# Patient Record
Sex: Male | Born: 1985 | Race: White | Hispanic: No | Marital: Single | Smoking: Former smoker
Health system: Southern US, Community
[De-identification: ages and names within clinical notes are randomized; demographics above are authoritative.]

## PROBLEM LIST (undated history)

## (undated) DIAGNOSIS — F419 Anxiety disorder, unspecified: Secondary | ICD-10-CM

## (undated) DIAGNOSIS — F988 Other specified behavioral and emotional disorders with onset usually occurring in childhood and adolescence: Secondary | ICD-10-CM

## (undated) HISTORY — PX: EYE SURGERY: SHX253

## (undated) HISTORY — PX: APPENDECTOMY: SHX54

## (undated) HISTORY — DX: Other specified behavioral and emotional disorders with onset usually occurring in childhood and adolescence: F98.8

---
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
## 2010-06-05 ENCOUNTER — Ambulatory Visit: Payer: Self-pay | Admitting: Internal Medicine

## 2010-06-05 DIAGNOSIS — J930 Spontaneous tension pneumothorax: Secondary | ICD-10-CM

## 2010-06-05 DIAGNOSIS — J939 Pneumothorax, unspecified: Secondary | ICD-10-CM | POA: Insufficient documentation

## 2010-06-05 HISTORY — DX: Spontaneous tension pneumothorax: J93.0

## 2010-06-06 ENCOUNTER — Telehealth: Payer: Self-pay | Admitting: Internal Medicine

## 2010-07-15 ENCOUNTER — Ambulatory Visit: Payer: Self-pay | Admitting: Internal Medicine

## 2010-10-04 ENCOUNTER — Telehealth (INDEPENDENT_AMBULATORY_CARE_PROVIDER_SITE_OTHER): Payer: Self-pay | Admitting: *Deleted

## 2010-10-07 ENCOUNTER — Ambulatory Visit: Payer: Self-pay | Admitting: Internal Medicine

## 2010-10-07 ENCOUNTER — Telehealth: Payer: Self-pay | Admitting: Internal Medicine

## 2010-10-07 DIAGNOSIS — F909 Attention-deficit hyperactivity disorder, unspecified type: Secondary | ICD-10-CM | POA: Insufficient documentation

## 2010-10-07 HISTORY — DX: Attention-deficit hyperactivity disorder, unspecified type: F90.9

## 2010-10-07 LAB — CONVERTED CEMR LAB: TSH: 1.34 microintl units/mL (ref 0.35–5.50)

## 2011-01-30 NOTE — Assessment & Plan Note (Signed)
Summary: Primary svc/  ADD, refer to psych start Adderal   Copy to:  Self  CC:  Follow up.  Pt c/o restless and being easily distracted since workloard started picking up.  Has been on adderall in the past and believes he needs to be restarted on it. Marland Kitchen  History of Present Illness: 17 yowm social smoker but quit for good June 2011  with h/o  L PTX by symptoms but only documented 2 or 3 times but last documented on Dec 02 2004 always treated with bedrest and no intervention.     July 15, 2010 6 wk followup with cxr to f/uptx.  Pt states no complaints today- no problems since last seen. not needing any advil, no sob or cough, not smoking at all.   cxr no ptx, rec stay off cis  October 07, 2010 Follow up.  Pt c/o restless and being easily distracted since workloard started picking up.  Has been on adderall in the past and believes he needs to be restarted on it. Previously rec'd from Pediatrician 20 mg per day with benefit.  Pt denies any significant sore throat, dysphagia, itching, sneezing,  nasal congestion or excess secretions,  fever, chills, sweats, unintended wt loss, pleuritic or exertional cp, hempoptysis, change in activity tolerance  orthopnea pnd or leg swelling   Preventive Screening-Counseling & Management  Alcohol-Tobacco     Smoking Status: quit < 6 months     Smoking Cessation Counseling: yes  Current Medications (verified): 1)  None  Allergies (verified): No Known Drug Allergies  Past History:  Past Medical History: Recurrent L  ptx since 2004     - CT Chest 06/22/2003 5% and no blebs     - Dec 2005 L 5 %     - June 05, 2010 5% > 100% resolved July 15, 2010  ADD     - restart adderal October 07, 2010 and refer to psych  Social History: Smoking Status:  quit < 6 months  Vital Signs:  Patient profile:   25 year old male Height:      65 inches Weight:      106.50 pounds BMI:     17.79 O2 Sat:      100 % on Room air Temp:     98.3 degrees F oral Pulse rate:    63 / minute BP sitting:   120 / 66  (left arm) Cuff size:   regular  Vitals Entered By: Gweneth Dimitri RN (October 07, 2010 10:55 AM)  O2 Flow:  Room air CC: Follow up.  Pt c/o restless and being easily distracted since workloard started picking up.  Has been on adderall in the past and believes he needs to be restarted on it.  Comments Medications reviewed with patient Daytime contact number verified with patient. Gweneth Dimitri RN  October 07, 2010 10:55 AM    Physical Exam  Additional Exam:  thin amb anxious wm nad wt 107 June 05, 2010 > 106 July 15, 2010  > 106 October 07, 2010  HEENT: nl dentition, turbinates, and orophanx. Nl external ear canals without cough reflex NECK :  without JVD/Nodes/TM/ nl carotid upstrokes bilaterally LUNGS: no acc muscle use, clear to A and P bilaterally without cough on insp or exp maneuvers CV:  RRR  no s3 or murmur or increase in P2, no edema  ABD:  soft and nontender with nl excursion in the supine position. No bruits or organomegaly, bowel sounds nl MS:  warm without deformities, calf tenderness, cyanosis or clubbing    Tests: (1) TSH (TSH)   FastTSH                   1.34 uIU/mL                 0.35-5.50  Impression & Recommendations:  Problem # 1:  ATTENTION DEFICIT HYPERACTIVITY DISORDER, ADULT (ICD-314.01)  Orders: Psychiatric Referral (Psych) TLB-TSH (Thyroid Stimulating Hormone) (84443-TSH) Est. Patient Level III (16109)  TSH ok    Medications Added to Medication List This Visit: 1)  Adderall Xr 20 Mg Xr24h-cap (Amphetamine-dextroamphetamine) .... One daily  Patient Instructions: 1)  Start Adderal 20 mg daily as you did previously until seen by a psychiatrist who accepts your  insurance 2)  See Patient Care Coordinator before leaving for referral Prescriptions: ADDERALL XR 20 MG XR24H-CAP (AMPHETAMINE-DEXTROAMPHETAMINE) one daily  #30 x 0   Entered and Authorized by:   Nyoka Cowden MD   Signed by:   Nyoka Cowden MD on  10/07/2010   Method used:   Print then Give to Patient   RxID:   [id number]    Immunization History:  Influenza Immunization History:    Influenza:  historical (08/29/2010)  Pneumovax Immunization History:    Pneumovax:  historical (07/29/2010)

## 2011-01-30 NOTE — Assessment & Plan Note (Signed)
Summary: Pulmonary/ L PTX   Visit Type:  Initial Consult Copy to:  Self  CC:  CP and Dyspnea.  History of Present Illness: 82 yowm social smoker only with h/o  L PTX by symptoms but only documented 2 or 3 times but last documented on Dec 02 2004 always treated with bedrest and no intervention.  June 05, 2010 cc stereotypical pain starting 6/2 slt better since onset and has been off feet since.  This pain was worse than usual, all on the left side,  = front and back, assoc with mild sob.  Usually no warning and no sign coughing or sneeze. No unusual hobbies.  Pt denies any significant sore throat, dysphagia, itching, sneezing,  nasal congestion or excess secretions,  fever, chills, sweats, unintended wt loss, pleuritic or exertional cp, hempoptysis, change in activity tolerance  orthopnea pnd or leg swelling Pt also denies any obvious fluctuation in symptoms with weather or environmental change or other alleviating or aggravating factors.       Current Medications (verified): 1)  None  Allergies (verified): No Known Drug Allergies  Past History:  Past Medical History: Recurrent L  ptx since 2004     - CT Chest 06/22/2003 5% and no blebs     - Dec 2005 L 5 %     - June 05, 2010 5%  Family History: Asthma- Sister  Social History: Single Former smoker.  Quit in Feb 2011.  "Was a social smoker" Owns a Surveyor, quantity business and also works at [employer] no elicit drug use.   Review of Systems       The patient complains of shortness of breath with activity and chest pain.  The patient denies shortness of breath at rest, productive cough, non-productive cough, coughing up blood, irregular heartbeats, acid heartburn, indigestion, loss of appetite, weight change, abdominal pain, difficulty swallowing, sore throat, tooth/dental problems, headaches, nasal congestion/difficulty breathing through nose, sneezing, itching, ear ache, anxiety, depression, hand/feet swelling, joint stiffness  or pain, rash, change in color of mucus, and fever.    Vital Signs:  Patient profile:   25 year old male Height:      65 inches Weight:      107 pounds BMI:     17.87 O2 Sat:      99 % on Room air Temp:     97.4 degrees F oral Pulse rate:   74 / minute BP sitting:   104 / 78  (left arm)  Vitals Entered By: Vernie Murders (June 05, 2010 1:24 PM)  O2 Flow:  Room air  Physical Exam  Additional Exam:  thin amb wm nad wt 107 June 05, 2010 HEENT: nl dentition, turbinates, and orophanx. Nl external ear canals without cough reflex NECK :  without JVD/Nodes/TM/ nl carotid upstrokes bilaterally LUNGS: no acc muscle use, clear to A and P bilaterally without cough on insp or exp maneuvers CV:  RRR  no s3 or murmur or increase in P2, no edema  ABD:  soft and nontender with nl excursion in the supine position. No bruits or organomegaly, bowel sounds nl MS:  warm without deformities, calf tenderness, cyanosis or clubbing SKIN: warm and dry without lesions   NEURO:  alert, approp, no deficits     CXR  Procedure date:  06/05/2010  Findings:      Comparison: 12/23/2004   Findings: There is a left apical pneumothorax, estimated to measure approximately 5% of lung volume.  The right lung  is clear.  Heart size is normal.  No evidence for pulmonary edema.   IMPRESSION: Left apical pneumothorax  Impression & Recommendations:  Problem # 1:  SPONTANEOUS PNEUMOTHORAX (ICD-512.8)  ? recurrent vs persistent L PTX as all the cxr's on file look the same.  Either way he is at definite risk of recurrent/ lifethreatening ptx with no triggers or reversible factors idenfied  Rec: See instructions for specific recommendations   Orders: New Patient Level V (16109)  Other Orders: T-2 View CXR (71020TC)  Patient Instructions: 1)  No heavy lifting thru 06/13/2010 2)  If condition worsens go to ER 3)  NEEDS F/U OV in 6 weeks to regroup then re future concerns   CXR  Procedure date:   06/05/2010  Findings:      Comparison: 12/23/2004   Findings: There is a left apical pneumothorax, estimated to measure approximately 5% of lung volume.  The right lung is clear.  Heart size is normal.  No evidence for pulmonary edema.   IMPRESSION: Left apical pneumothorax

## 2011-01-30 NOTE — Progress Notes (Signed)
Summary: returned call  Phone Note Call from Patient Call back at [phone]   Caller: Patient Call For: Jerimey Burridge Summary of Call: Returning Leslie's call. Initial call taken by: Darletta Moll,  June 06, 2010 9:50 AM  Follow-up for Phone Call        pt advised of CXR per append. Appt set fro 07-15-10 at 2:20 with cxr first. order for cxr order placed. Carron Curie CMA  June 06, 2010 10:05 AM

## 2011-01-30 NOTE — Progress Notes (Signed)
Summary: RESULTS  Phone Note Call from Patient   Caller: Patient Call For: WERT Summary of Call: CALLING FOR THYROID RESULTS Initial call taken by: Rickard Patience,  October 07, 2010 5:00 PM  Follow-up for Phone Call        Patient is aware TSH level is normal. He would like to know what his blood type is. Is it okay to order additional lab? Please advise.Michel Bickers [facility]  October 07, 2010 5:06 PM this is a medically relevant study we don't do it though he could certainly [facility] if he wants to [facility] blood Follow-up by: Nyoka Cowden MD,  October 07, 2010 5:15 PM  Additional Follow-up for Phone Call Additional follow up Details #1::        called and spoke with pt and he is aware per MW recs to try the [facility] for this. Randell Loop CMA  October 07, 2010 5:19 PM

## 2011-01-30 NOTE — Progress Notes (Signed)
Summary: requests adderall  Phone Note Call from Patient   Caller: Patient Call For: wert Summary of Call: pt says he used to take adderall for his ADD (from childhood until 9th grade). says he feels he needs to take this again as he has trouble staying focused. 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 call his cell [phone] Initial call taken by: Tivis Ringer, CNA,  October 04, 2010 3:03 PM  Follow-up for Phone Call        Called, spoke with pt.  c/o being restless and easily distracted.  Has been on adderall in the past.  Thinks he needs this again.  ROV scheduled with MW for 10.10.11 at 10:20 to discuss with MW.  Pt aware.  Follow-up by: Gweneth Dimitri RN,  October 04, 2010 3:13 PM

## 2011-01-30 NOTE — Assessment & Plan Note (Signed)
Summary: Primary svc/ f/u cxr with ptx resolved   Copy to:  Self  CC:  6 wk followup with cxr.  Pt states no complaints today- no problems since last seen.Marland Kitchen  History of Present Illness: 24 yowm social smoker only with h/o  L PTX by symptoms but only documented 2 or 3 times but last documented on Dec 02 2004 always treated with bedrest and no intervention.  June 05, 2010 cc stereotypical pain starting 6/2 slt better since onset and has been off feet since.  This pain was worse than usual, all on the left side,  = front and back, assoc with mild sob.  Usually no warning and no sign coughing or sneeze. No unusual hobbies.  rec conservative rx > pain resolved after 2 weeks and stopped requiring advil.   July 15, 2010 6 wk followup with cxr.  Pt states no complaints today- no problems since last seen. not needing any advil, no sob or cough, not smoking at all. Pt denies any significant sore throat, dysphagia, itching, sneezing,  nasal congestion or excess secretions,  fever, chills, sweats, unintended wt loss, pleuritic or exertional cp, hempoptysis, change in activity tolerance  orthopnea pnd or leg swelling. occ aches and pains which are positional, better with icy hot.     Current Medications (verified): 1)  None  Allergies (verified): No Known Drug Allergies  Past History:  Past Medical History: Recurrent L  ptx since 2004     - CT Chest 06/22/2003 5% and no blebs     - Dec 2005 L 5 %     - June 05, 2010 5% > 100% resolved July 15, 2010   Vital Signs:  Patient profile:   25 year old male Weight:      106 pounds O2 Sat:      98 % on Room air Temp:     98.3 degrees F oral Pulse rate:   81 / minute BP sitting:   98 / 60  (left arm)  Vitals Entered By: Vernie Murders (July 15, 2010 2:16 PM)  O2 Flow:  Room air  Physical Exam  Additional Exam:  thin amb wm nad wt 107 June 05, 2010 > 106 July 15, 2010  HEENT: nl dentition, turbinates, and orophanx. Nl external ear canals without  cough reflex NECK :  without JVD/Nodes/TM/ nl carotid upstrokes bilaterally LUNGS: no acc muscle use, clear to A and P bilaterally without cough on insp or exp maneuvers CV:  RRR  no s3 or murmur or increase in P2, no edema  ABD:  soft and nontender with nl excursion in the supine position. No bruits or organomegaly, bowel sounds nl MS:  warm without deformities, calf tenderness, cyanosis or clubbing     CXR  Procedure date:  07/15/2010  Findings:      Resolved PTX  Impression & Recommendations:  Problem # 1:  SPONTANEOUS PNEUMOTHORAX (ICD-512.8)  Resolved, discussed how to approach recurrence but if w/in one year def needs Tsurg eval for pleurodesis  Orders: Est. Patient Level III (95621)  Patient Instructions: 1)  Return if chest pain recurs, if with short of breath go straight to the ER. 2)  Stretching/ advil for pain 3)  moderate exercise is ok but no heavy lifting/straining

## 2011-09-10 ENCOUNTER — Telehealth: Payer: Self-pay | Admitting: Internal Medicine

## 2011-09-10 ENCOUNTER — Ambulatory Visit (INDEPENDENT_AMBULATORY_CARE_PROVIDER_SITE_OTHER): Payer: BC Managed Care – PPO | Admitting: Pulmonary Disease

## 2011-09-10 ENCOUNTER — Encounter: Payer: Self-pay | Admitting: Pulmonary Disease

## 2011-09-10 VITALS — BP 102/60 | HR 94 | Temp 98.6°F | Ht 66.5 in | Wt 101.0 lb

## 2011-09-10 DIAGNOSIS — J069 Acute upper respiratory infection, unspecified: Secondary | ICD-10-CM

## 2011-09-10 NOTE — Progress Notes (Signed)
  Subjective:    Patient ID: Raymond [name], male    DOB: 31-Jul-1986, 25 y.o.   MRN: [id number]  HPI The patient comes in today for an acute sick visit.  He gives a three-day history of malaise, subjective fever, sore throat, but no significant nasal congestion or chest congestion.  He denies any cough or mucus production.  He has had no pressure in his sinuses or ears.  Denies all GI symptoms.   Review of Systems  Constitutional: Positive for chills. Negative for fever and unexpected weight change.  HENT: Positive for ear pain and sore throat. Negative for nosebleeds, congestion, rhinorrhea, sneezing, trouble swallowing, dental problem, postnasal drip and sinus pressure.   Eyes: Negative for redness and itching.  Respiratory: Positive for cough and shortness of breath. Negative for chest tightness and wheezing.   Cardiovascular: Negative for palpitations and leg swelling.  Gastrointestinal: Negative for nausea and vomiting.  Genitourinary: Negative for dysuria.  Musculoskeletal: Negative for joint swelling.  Skin: Negative for rash.  Neurological: Positive for headaches.  Hematological: Does not bruise/bleed easily.  Psychiatric/Behavioral: Negative for dysphoric mood. The patient is not nervous/anxious.        Objective:   Physical Exam Thin male in no acute distress Nose without purulence or discharge noted.  Minimal erythema Oropharynx without exudates, no erythema or swelling Neck without palpable lymphadenopathy Chest totally clear to auscultation Heart exam with regular rate and rhythm Lower extremities without edema Alert and oriented, moves all 4 extremities       Assessment & Plan:

## 2011-09-10 NOTE — Telephone Encounter (Signed)
I spoke with pt and he c/o chills, fever, congestion. Pt requested to be seen today. Since MW has no openings nor does TP. Per leslie okay to book pt with another provider. Pt is coming in to see KC at 12:00 today.

## 2011-09-10 NOTE — Assessment & Plan Note (Signed)
The patient's history is most consistent with a viral URI.  I have asked him to work on symptom relief with over-the-counter medications, but he is to let us know if he has any documented fever or if he begins to produce purulent mucus.  He is also to let us know if his condition worsens.

## 2011-09-10 NOTE — Patient Instructions (Signed)
Take tylenol cold and sinus per directions on box Drink plenty of fluids. If you have a fever of 101 or greater, or if you begin to have discolored mucus from nose or chest, please call us.  followup with Dr. Sherene Sires, but let us know if not getting better.

## 2011-09-11 ENCOUNTER — Ambulatory Visit: Payer: Self-pay | Admitting: Internal Medicine

## 2011-09-11 ENCOUNTER — Telehealth: Payer: Self-pay | Admitting: Internal Medicine

## 2011-09-11 MED ORDER — PROMETHAZINE HCL 25 MG RE SUPP: 25.0000 mg | Freq: Four times a day (QID) | RECTAL | Status: AC | PRN

## 2011-09-11 NOTE — Telephone Encounter (Signed)
It does sound like a viral illness.  Would stop advil.  Advance fluids as tolerated.  May have phenergan suppository 25mg  #6 1 PR every 6 hr As needed  Nausea/vomitting. - NO refills.  Advance diet as tolerated, avoid lactose for 5 days  Push fluids , if not improving or worsens with fevers, abdominal pain or chest pain will need sooner follow up or ER.  Will be glad to see in office in am if not improving.  If worsens will need ER Please contact office for sooner follow up if symptoms do not improve or worsen or seek emergency care

## 2011-09-11 NOTE — Telephone Encounter (Signed)
Spoke with pt. He was seen by [facility] yesterday for acute visit- sore throat, cough, chills. He states tried [employer] of advil cold and sinus, and is since then worse. He has been vomiting since this am, nausea, sore throat, aches, chills-unsure if he has fever or not b/c has no thermometer. He states thinks that he may have the flu. Will forward to TP for recs, pls advise, thanks!

## 2011-09-11 NOTE — Telephone Encounter (Signed)
Rx sent. Spoke with pt and notified of all recs per TP. He verbalized understanding and denied any questions. States will call first thing in the am for ov if not improving.

## 2011-09-12 ENCOUNTER — Ambulatory Visit: Payer: BC Managed Care – PPO | Admitting: Adult Health

## 2011-09-12 ENCOUNTER — Ambulatory Visit: Payer: BC Managed Care – PPO

## 2011-09-12 ENCOUNTER — Ambulatory Visit (INDEPENDENT_AMBULATORY_CARE_PROVIDER_SITE_OTHER): Payer: BC Managed Care – PPO | Admitting: Adult Health

## 2011-09-12 ENCOUNTER — Encounter: Payer: Self-pay | Admitting: Adult Health

## 2011-09-12 VITALS — BP 140/70 | HR 84 | Temp 97.7°F | Ht 65.0 in | Wt 101.0 lb

## 2011-09-12 DIAGNOSIS — J029 Acute pharyngitis, unspecified: Secondary | ICD-10-CM

## 2011-09-12 DIAGNOSIS — J069 Acute upper respiratory infection, unspecified: Secondary | ICD-10-CM

## 2011-09-12 LAB — BETA STREP SCREEN: Streptococcus, Group A Screen (Direct): NEGATIVE

## 2011-09-12 NOTE — Patient Instructions (Signed)
Salt water gargles As needed   Tylenol As needed  Fever /aches PUsh fluids  Advance bland diet as tolerated I will call with strep test Phenergan As needed  For nausea .  Please contact office for sooner follow up if symptoms do not improve or worsen or seek emergency care

## 2011-09-14 NOTE — Assessment & Plan Note (Signed)
Slow to resolve URI/ with resolving gastritis- appearing to be viral.  Strep test is neg.   Plan:  Salt water gargles As needed   Tylenol As needed  Fever /aches PUsh fluids  Advance bland diet as tolerated Phenergan As needed  For nausea .  Please contact office for sooner follow up if symptoms do not improve or worsen or seek emergency care

## 2011-09-14 NOTE — Progress Notes (Signed)
Subjective:    Patient ID: Raymond [name], male    DOB: [birth year], 25 y.o.   MRN: [id number]  HPI 44 yowm social smoker but quit for good June 2011 with h/o L PTX by symptoms but only documented 2 or 3 times but last documented on Dec 02 2004 always treated with bedrest and no intervention.   July 15, 2010 6 wk followup with cxr to f/uptx. Pt states no complaints today- no problems since last seen. not needing any advil, no sob or cough, not smoking at all. cxr no ptx, rec stay off cis   October 07, 2010 Follow up. Pt c/o restless and being easily distracted since workloard started picking up. Has been on adderall in the past and believes he needs to be restarted on it. Previously rec'd from Pediatrician 20 mg per day with benefit. >>Rx Adderall, and refer to Psych   09/12/11 Acute OV  Complains of  chills, sore throat, cough w/ clear phlem, chest congestion, , feels exhausted, loss of apetite for 5 days . Developed upset stomach 2 days ago with n/v. Had body aches, malaise and chills. Joint aches and nausea. Called into office with phenergan suppository phoned in. Nausea and vomitting have stopped. He is eating today. Still has sore throat. Painful to swallow.  Was seen in office 2 days ago by Dr. Shelle Iron with URI symptoms w/ recommendations for symptomatic tx. NO chest pain, pleuritic pain , diarrhea, urinary symptoms , rash , recent travel or abx use.    Past Medical History:  Recurrent L ptx since 2004  - CT Chest 06/22/2003 5% and no blebs  - Dec 2005 L 5 %  - June 05, 2010 5% > 100% resolved July 15, 2010  ADD  - restart adderal October 07, 2010 and refer to psych    Review of Systems Constitutional:   No  weight loss, night sweats,   +Fevers, chills, fatigue, or  lassitude.  HEENT:   No headaches,  Difficulty swallowing,  Tooth/dental problems,               + sneezing, itching, nasal congestion, post nasal drip,   CV:  No chest pain,  Orthopnea, PND, swelling in lower  extremities, anasarca, dizziness, palpitations, syncope.   GI  No heartburn, indigestion, abdominal pain, diarrhea, change in bowel habits, loss of appetite, bloody stools.   Resp: No shortness of breath with exertion or at rest.     No coughing up of blood.  No change in color of mucus.  No wheezing.  No chest wall deformity  Skin: no rash or lesions.  GU: no dysuria, change in color of urine, no urgency or frequency.  No flank pain, no hematuria   MS:  No joint pain or swelling.  No decreased range of motion.  No back pain.  Psych:  No change in mood or affect. No depression or anxiety.  No memory loss.         Objective:   Physical Exam GEN: A/Ox3; pleasant , NAD, thin male   HEENT:  [place]/AT,  EACs-clear, TMs-wnl, NOSE-clear, THROAT-red no exudate or  lesions, no postnasal drip or exudate noted.   NECK:  Supple w/ fair ROM; no JVD; normal carotid impulses w/o bruits; no thyromegaly or nodules palpated; no lymphadenopathy.  RESP  Clear  P & A; w/o, wheezes/ rales/ or rhonchi.no accessory muscle use, no dullness to percussion  CARD:  RRR, no m/r/g  , no peripheral edema, pulses intact, no  cyanosis or clubbing.  GI:   Soft & nt; nml bowel sounds; no organomegaly or masses detected.  Musco: Warm bil, no deformities or joint swelling noted.   Neuro: alert, no focal deficits noted.    Skin: Warm, no lesions or rashes         Assessment & Plan:

## 2011-09-26 ENCOUNTER — Emergency Department (HOSPITAL_COMMUNITY)
Admission: EM | Admit: 2011-09-26 | Discharge: 2011-09-27 | Disposition: A | Discharge status: Home / Self Care | Payer: BC Managed Care – PPO | Attending: Emergency Medicine | Admitting: Emergency Medicine

## 2011-09-26 DIAGNOSIS — R112 Nausea with vomiting, unspecified: Secondary | ICD-10-CM | POA: Insufficient documentation

## 2011-09-26 DIAGNOSIS — F1210 Cannabis abuse, uncomplicated: Secondary | ICD-10-CM | POA: Insufficient documentation

## 2011-09-26 DIAGNOSIS — F1010 Alcohol abuse, uncomplicated: Secondary | ICD-10-CM | POA: Insufficient documentation

## 2011-09-27 LAB — URINALYSIS, ROUTINE W REFLEX MICROSCOPIC
Bilirubin Urine: NEGATIVE
Glucose, UA: NEGATIVE mg/dL
Ketones, ur: NEGATIVE mg/dL
Leukocytes, UA: NEGATIVE
Nitrite: NEGATIVE
Protein, ur: NEGATIVE mg/dL
Specific Gravity, Urine: 1.031 — ABNORMAL HIGH (ref 1.005–1.030)
Urobilinogen, UA: 0.2 mg/dL (ref 0.0–1.0)
pH: 5 (ref 5.0–8.0)

## 2011-09-27 LAB — DIFFERENTIAL
Basophils Relative: 1 % (ref 0–1)
Eosinophils Absolute: 0.1 10*3/uL (ref 0.0–0.7)
Eosinophils Relative: 1 % (ref 0–5)
Lymphocytes Relative: 37 % (ref 12–46)
Monocytes Absolute: 0.3 10*3/uL (ref 0.1–1.0)
Monocytes Relative: 4 % (ref 3–12)
Neutrophils Relative %: 57 % (ref 43–77)

## 2011-09-27 LAB — CBC
MCH: 31.4 pg (ref 26.0–34.0)
MCHC: 35.9 g/dL (ref 30.0–36.0)
Platelets: 285 10*3/uL (ref 150–400)
RBC: 4.43 MIL/uL (ref 4.22–5.81)
RDW: 12.6 % (ref 11.5–15.5)
WBC: 7.8 10*3/uL (ref 4.0–10.5)

## 2011-09-27 LAB — POCT I-STAT, CHEM 8
BUN: 18 mg/dL (ref 6–23)
Calcium, Ion: 1.1 mmol/L — ABNORMAL LOW (ref 1.12–1.32)
Chloride: 107 mEq/L (ref 96–112)
Creatinine, Ser: 1.1 mg/dL (ref 0.50–1.35)
Glucose, Bld: 137 mg/dL — ABNORMAL HIGH (ref 70–99)
HCT: 43 % (ref 39.0–52.0)
Hemoglobin: 14.6 g/dL (ref 13.0–17.0)
Potassium: 3.6 mEq/L (ref 3.5–5.1)
Sodium: 143 mEq/L (ref 135–145)
TCO2: 22 mmol/L (ref 0–100)

## 2011-09-27 LAB — RAPID URINE DRUG SCREEN, HOSP PERFORMED
Amphetamines: NOT DETECTED
Barbiturates: NOT DETECTED
Benzodiazepines: NOT DETECTED
Cocaine: NOT DETECTED
Tetrahydrocannabinol: POSITIVE — AB

## 2011-09-27 LAB — ETHANOL: Alcohol, Ethyl (B): 80 mg/dL — ABNORMAL HIGH (ref 0–11)

## 2012-04-16 ENCOUNTER — Encounter: Payer: Self-pay | Admitting: *Deleted

## 2012-04-19 ENCOUNTER — Ambulatory Visit: Payer: BC Managed Care – PPO | Admitting: Internal Medicine

## 2012-09-15 ENCOUNTER — Other Ambulatory Visit (INDEPENDENT_AMBULATORY_CARE_PROVIDER_SITE_OTHER): Payer: BC Managed Care – PPO

## 2012-09-15 ENCOUNTER — Ambulatory Visit (INDEPENDENT_AMBULATORY_CARE_PROVIDER_SITE_OTHER): Payer: BC Managed Care – PPO | Admitting: Adult Health

## 2012-09-15 ENCOUNTER — Encounter: Payer: Self-pay | Admitting: Adult Health

## 2012-09-15 VITALS — BP 110/72 | HR 65 | Temp 97.5°F | Ht 65.0 in | Wt 101.4 lb

## 2012-09-15 DIAGNOSIS — R5383 Other fatigue: Secondary | ICD-10-CM

## 2012-09-15 DIAGNOSIS — J069 Acute upper respiratory infection, unspecified: Secondary | ICD-10-CM

## 2012-09-15 DIAGNOSIS — F419 Anxiety disorder, unspecified: Secondary | ICD-10-CM

## 2012-09-15 DIAGNOSIS — R5381 Other malaise: Secondary | ICD-10-CM

## 2012-09-15 DIAGNOSIS — F411 Generalized anxiety disorder: Secondary | ICD-10-CM

## 2012-09-15 LAB — CBC
HCT: 44.3 % (ref 39.0–52.0)
Hemoglobin: 14.7 g/dL (ref 13.0–17.0)
MCHC: 33.1 g/dL (ref 30.0–36.0)
MCV: 94.1 fl (ref 78.0–100.0)
RDW: 13.7 % (ref 11.5–14.6)
WBC: 6.8 10*3/uL (ref 4.5–10.5)

## 2012-09-15 LAB — BASIC METABOLIC PANEL
BUN: 18 mg/dL (ref 6–23)
CO2: 28 mEq/L (ref 19–32)
Calcium: 9.5 mg/dL (ref 8.4–10.5)
Chloride: 102 mEq/L (ref 96–112)
Creatinine, Ser: 1 mg/dL (ref 0.4–1.5)
Glucose, Bld: 137 mg/dL — ABNORMAL HIGH (ref 70–99)
Potassium: 4.6 mEq/L (ref 3.5–5.1)
Sodium: 138 mEq/L (ref 135–145)

## 2012-09-15 LAB — HEPATIC FUNCTION PANEL
ALT: 18 U/L (ref 0–53)
AST: 37 U/L (ref 0–37)
Albumin: 4.8 g/dL (ref 3.5–5.2)
Alkaline Phosphatase: 43 U/L (ref 39–117)
Bilirubin, Direct: 0.3 mg/dL (ref 0.0–0.3)
Total Bilirubin: 1.3 mg/dL — ABNORMAL HIGH (ref 0.3–1.2)
Total Protein: 7.6 g/dL (ref 6.0–8.3)

## 2012-09-15 LAB — BETA STREP SCREEN: Streptococcus, Group A Screen (Direct): NEGATIVE

## 2012-09-15 NOTE — Progress Notes (Signed)
Subjective:    Patient ID: Raymond [name], male    DOB: January 09, 1986, 26 y.o.   MRN: [id number]  HPI 64 yowm social smoker but quit for good June 2011 with h/o L PTX by symptoms but only documented 2 or 3 times but last documented on Dec 02 2004 always treated with bedrest and no intervention.   July 15, 2010 6 wk followup with cxr to f/uptx. Pt states no complaints today- no problems since last seen. not needing any advil, no sob or cough, not smoking at all. cxr no ptx, rec stay off cis   October 07, 2010 Follow up. Pt c/o restless and being easily distracted since workloard started picking up. Has been on adderall in the past and believes he needs to be restarted on it. Previously rec'd from Pediatrician 20 mg per day with benefit. >>Rx Adderall, and refer to Psych   09/12/11 Acute OV  Complains of  chills, sore throat, cough w/ clear phlem, chest congestion, , feels exhausted, loss of apetite for 5 days . Developed upset stomach 2 days ago with n/v. Had body aches, malaise and chills. Joint aches and nausea. Called into office with phenergan suppository phoned in. Nausea and vomitting have stopped. He is eating today. Still has sore throat. Painful to swallow.  Was seen in office 2 days ago by Dr. Shelle Iron with URI symptoms w/ recommendations for symptomatic tx. NO chest pain, pleuritic pain , diarrhea, urinary symptoms , rash , recent travel or abx use.  >>supportive /symptom tx   09/15/2012 Acute OV  Complains of sore throat , congested , drainage for 3 days and  tired, no energy for last 2 days.  He has not used any over-the-counter medications for treatment. Denies any discolored mucus, chest pain, shortness of breath, leg swelling, or hemoptysis. He does admit to smoking marijuana on a regular basis. Is currently seeing a psychologist for depression and anxiety and is on Tranxene and Celexa We discussed the dangers of using marijuana and alcohol along with his prescription  medications.     Past Medical History:  Recurrent L ptx since 2004  - CT Chest 06/22/2003 5% and no blebs  - Dec 2005 L 5 %  - June 05, 2010 5% > 100% resolved July 15, 2010  ADD  - restart adderal October 07, 2010 and refer to psych    Review of Systems Constitutional:   No  weight loss, night sweats,  Fevers, chills,  +fatigue, or  lassitude.  HEENT:   No headaches,  Difficulty swallowing,  Tooth/dental problems,               +nasal congestion, post nasal drip, sore throat   CV:  No chest pain,  Orthopnea, PND, swelling in lower extremities, anasarca, dizziness, palpitations, syncope.   GI  No heartburn, indigestion, abdominal pain, diarrhea, change in bowel habits, loss of appetite, bloody stools.   Resp: No shortness of breath with exertion or at rest.     No coughing up of blood.  No change in color of mucus.  No wheezing.  No chest wall deformity  Skin: no rash or lesions.  GU: no dysuria, change in color of urine, no urgency or frequency.  No flank pain, no hematuria   MS:  No joint pain or swelling.  No decreased range of motion.  No back pain.  Psych:  No change in mood or affect. No depression or anxiety.  No memory loss.  Objective:   Physical Exam GEN: A/Ox3; pleasant , NAD, thin male   HEENT:  [place]/AT,  EACs-clear, TMs-wnl, NOSE-clear, THROAT-red no exudate or  lesions, no postnasal drip or exudate noted.   NECK:  Supple w/ fair ROM; no JVD; normal carotid impulses w/o bruits; no thyromegaly or nodules palpated; no lymphadenopathy.  RESP  Clear  P & A; w/o, wheezes/ rales/ or rhonchi.no accessory muscle use, no dullness to percussion  CARD:  RRR, no m/r/g  , no peripheral edema, pulses intact, no cyanosis or clubbing.  GI:   Soft & nt; nml bowel sounds; no organomegaly or masses detected.  Musco: Warm bil, no deformities or joint swelling noted.   Neuro: alert, no focal deficits noted.    Skin: Warm, no lesions or rashes          Assessment & Plan:

## 2012-09-15 NOTE — Patient Instructions (Addendum)
Fluids and rest  Salt water gargles  Tylenol As needed   Claritin daily As needed  Drainage.  I will call with labs  Please contact office for sooner follow up if symptoms do not improve or worsen or seek emergency care

## 2012-09-16 DIAGNOSIS — F419 Anxiety disorder, unspecified: Secondary | ICD-10-CM | POA: Insufficient documentation

## 2012-09-16 NOTE — Assessment & Plan Note (Signed)
Advised on dangers of mixing etoh/marajana w/ his prescription drugs.  Complaints of fatigue and low energy  Will check labs but suspect combination of medications and use of polysubstance  ER notes reviewed w/ pt regarding etoh intoxication and marajuana use

## 2012-09-16 NOTE — Assessment & Plan Note (Signed)
Strep test neg  Plan Fluids and rest  Salt water gargles  Tylenol As needed   Claritin daily As needed  Drainage.  I will call with labs  Please contact office for sooner follow up if symptoms do not improve or worsen or seek emergency care

## 2012-09-17 NOTE — Progress Notes (Signed)
Quick Note:  Called spoke with patient, advised of lab results / recs as stated by TP. Pt verbalized his understanding and denied any questions. ______ 

## 2012-10-07 ENCOUNTER — Ambulatory Visit: Payer: BC Managed Care – PPO | Admitting: Pulmonary Disease

## 2012-10-07 ENCOUNTER — Ambulatory Visit (INDEPENDENT_AMBULATORY_CARE_PROVIDER_SITE_OTHER)
Admission: RE | Admit: 2012-10-07 | Discharge: 2012-10-07 | Disposition: A | Discharge status: Home / Self Care | Payer: BC Managed Care – PPO | Source: Ambulatory Visit | Attending: Internal Medicine | Admitting: Internal Medicine

## 2012-10-07 ENCOUNTER — Encounter: Payer: Self-pay | Admitting: *Deleted

## 2012-10-07 ENCOUNTER — Ambulatory Visit (INDEPENDENT_AMBULATORY_CARE_PROVIDER_SITE_OTHER): Payer: BC Managed Care – PPO | Admitting: Internal Medicine

## 2012-10-07 ENCOUNTER — Telehealth: Payer: Self-pay | Admitting: Internal Medicine

## 2012-10-07 ENCOUNTER — Other Ambulatory Visit: Payer: Self-pay | Admitting: Internal Medicine

## 2012-10-07 ENCOUNTER — Encounter: Payer: Self-pay | Admitting: Internal Medicine

## 2012-10-07 VITALS — BP 102/80 | HR 83 | Temp 97.7°F | Ht 65.0 in | Wt 101.4 lb

## 2012-10-07 DIAGNOSIS — Z8709 Personal history of other diseases of the respiratory system: Secondary | ICD-10-CM

## 2012-10-07 DIAGNOSIS — J930 Spontaneous tension pneumothorax: Secondary | ICD-10-CM

## 2012-10-07 DIAGNOSIS — R079 Chest pain, unspecified: Secondary | ICD-10-CM

## 2012-10-07 MED ORDER — TRAMADOL HCL 50 MG PO TABS: 50.0000 mg | ORAL_TABLET | Freq: Four times a day (QID) | ORAL | Status: DC | PRN

## 2012-10-07 NOTE — Assessment & Plan Note (Signed)
-   CT Chest 06/22/2003 5% and no blebs  - Dec 2005 L 5 %  - June 05, 2010 5% > 100% resolved July 15, 2010  - 10/07/2012 recurrence x 10% >  Referred to T Surgery  I had an extended discussion with the patient today lasting 15 to 20 minutes of a 25 minute visit on the following issues:  This is now the 4th recurrence so strongly rec surgical eval and he agreed to go > referred

## 2012-10-07 NOTE — Telephone Encounter (Signed)
Set pt for today @ 11:30 w/ Dr. Frederico Hamman per Mindy.  Pt verbalized understanding & stated nothing further needed at this time.  Raymond [name]

## 2012-10-07 NOTE — Telephone Encounter (Signed)
I spoke with pt and he c/o sharp pains in his chest, difficulty breathing, wheezing last night. He called EMS and his vitals checked out fine. I advised pt will need OV to be evaluated. He stated he is not able to come in bc his car is int he shop. I advised pt will need to see him for an evaluation. He is going to call back bc he is going to try and see if he can borrow someone's car.

## 2012-10-07 NOTE — Telephone Encounter (Signed)
MW aware of this- states that the pt at least needs to be seen by [facility] within a wks time.  I called and LMOM for the pt letting him know appt still pending and it is in his best interest to keep this, and that if he is worried about starting new job next wk, it makes sense to keep the appt for tomorrow so he won't have to miss work. We already gave work excuse for his other job. I asked that he call back asap so we can discuss. Will forward to MW so he is aware.

## 2012-10-07 NOTE — Progress Notes (Signed)
Subjective:    Patient ID: Raymond [name], male    DOB: 08-Oct-1986   MRN: [id number]  HPI 56 yowm social smoker with h/o L PTX by symptoms but only documented 2 or 3 times but last documented on Dec 02 2004 always treated with bedrest and no intervention.   July 15, 2010 6 wk followup with cxr to f/uptx. Pt states no complaints today- no problems since last seen. not needing any advil, no sob or cough, not smoking at all. cxr no ptx, rec stay off cis   October 07, 2010 Follow up. Pt c/o restless and being easily distracted since workloard started picking up. Has been on adderall in the past and believes he needs to be restarted on it. Previously rec'd from Pediatrician 20 mg per day with benefit. >>Rx Adderall, and refer to Psych   09/12/11 Acute OV  Complains of  chills, sore throat, cough w/ clear phlem, chest congestion, , feels exhausted, loss of apetite for 5 days . Developed upset stomach 2 days ago with n/v. Had body aches, malaise and chills. Joint aches and nausea. Called into office with phenergan suppository phoned in. Nausea and vomitting have stopped. He is eating today. Still has sore throat. Painful to swallow.  Was seen in office 2 days ago by Dr. Shelle Iron with URI symptoms w/ recommendations for symptomatic tx. NO chest pain, pleuritic pain , diarrhea, urinary symptoms , rash , recent travel or abx use.  >>supportive /symptom tx   09/15/2012 Acute OV  Complains of sore throat , congested , drainage for 3 days and  tired, no energy for last 2 days.  He has not used any over-the-counter medications for treatment. Denies any discolored mucus, chest pain, shortness of breath, leg swelling, or hemoptysis. He does admit to smoking marijuana on a regular basis. Is currently seeing a psychologist for depression and anxiety and is on Tranxene and Celexa We discussed the dangers of using marijuana and alcohol along with his prescription medications. rec No evidence of acute cardiopulmonary  disease.  10/07/2012 f/u ov/Raymond [name] quit smoking 09/01/12 cc acute onset ant L cp 1030 pm 10/9 rad to  L shoulder and back with  some increase  With Cough  but no purulent sputum or sob. Has not taken any meds yet.  Sleeping ok without nocturnal  or early am exacerbation  of respiratory  c/o's or need for noct saba. Also denies any obvious fluctuation of symptoms with weather or environmental changes or other aggravating or alleviating factors except as outlined above   ROS  The following are not active complaints unless bolded sore throat, dysphagia, dental problems, itching, sneezing,  nasal congestion or excess/ purulent secretions, ear ache,   fever, chills, sweats, unintended wt loss, pleuritic cp or exertional cp, hemoptysis,  orthopnea pnd or leg swelling, presyncope, palpitations, heartburn, abdominal pain, anorexia, nausea, vomiting, diarrhea  or change in bowel or urinary habits, change in stools or urine, dysuria,hematuria,  rash, arthralgias, visual complaints, headache, numbness weakness or ataxia or problems with walking or coordination,  change in mood/affect or memory.         Past Medical History:  Recurrent L ptx since 2004  - CT Chest 06/22/2003 5% and no blebs  - Dec 2005 L 5 %  - June 05, 2010 5% > 100% resolved July 15, 2010  - 10/07/2012 recurrence x 10% >  Referred to [facility] Surgery ADD  - restart adderal October 07, 2010 and refer to psych  Objective:   Physical Exam GEN: A/Ox3; pleasant , NAD, thin male  Wt Readings from Last 3 Encounters:  10/07/12 101 lb 6.4 oz (45.995 kg)  09/15/12 101 lb 6.4 oz (45.995 kg)  09/12/11 101 lb (45.813 kg)     HEENT:  [place]/AT,  EACs-clear, TMs-wnl, NOSE-clear, THROAT-red no exudate or  lesions, no postnasal drip or exudate noted.   NECK:  Supple w/ fair ROM; no JVD; normal carotid impulses w/o bruits; no thyromegaly or nodules palpated; no lymphadenopathy.  RESP  Clear  P & A; w/o, wheezes/ rales/ or rhonchi.no  accessory muscle use, no dullness to percussion  CARD:  RRR, no m/r/g  , no peripheral edema, pulses intact, no cyanosis or clubbing neg haman's crunch.  GI:   Soft & nt; nml bowel sounds; no organomegaly or masses detected.  Musco: Warm bil, no deformities or joint swelling noted.   Neuro: alert, no focal deficits noted.    Skin: Warm, no lesions or rashes   CXR  10/07/2012 : 10% left pneumothorax.        Assessment & Plan:

## 2012-10-07 NOTE — Patient Instructions (Addendum)
Stay off your feet for now and take tramadol 50 mg every 4 hours as needed for pain or cough.  Please see patient coordinator before you leave today  to schedule evaluation by a thoracic surgeon if more than a week delay in being seen by surgery you will need to return here for cxr.  We can excuse you from work at [employer] until the pneumothorax is gone or you see Designer, industrial/product, whichever comes first

## 2012-10-08 ENCOUNTER — Encounter: Payer: BC Managed Care – PPO | Admitting: Cardiothoracic Surgery
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
## 2012-10-08 NOTE — Telephone Encounter (Signed)
Called received from [facility] at [facility] (Dr. Tyrone Sage) pt did not show for his appointment today and did not call his office either. Just FYI for Dr. Sherene Sires. Rhonda J Cobb

## 2012-10-19 ENCOUNTER — Encounter: Payer: Self-pay | Admitting: *Deleted

## 2012-10-19 ENCOUNTER — Telehealth: Payer: Self-pay | Admitting: Internal Medicine

## 2012-10-19 NOTE — Telephone Encounter (Signed)
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 Note that he no showed for [facility] appt that we set up for him. Please advise, thanks!

## 2012-10-19 NOTE — Telephone Encounter (Signed)
Ok to return to work

## 2012-10-19 NOTE — Telephone Encounter (Signed)
Letter up front for pick up [facility] for pt to be aware

## 2012-10-19 NOTE — Telephone Encounter (Signed)
Pt returned call. Asks that nurse leave a detailed msg as he  "cannot continue to play phone tag- can't call back again today". Hazel Sams

## 2012-10-19 NOTE — Telephone Encounter (Signed)
Pt will be at this number till 1:30 after that not reachable till after 5:30 pnemothorax is better also needs note to go back to work

## 2012-10-19 NOTE — Telephone Encounter (Signed)
LMTCB

## 2012-10-19 NOTE — Telephone Encounter (Signed)
lmomtcb x1 

## 2012-10-26 ENCOUNTER — Encounter (HOSPITAL_BASED_OUTPATIENT_CLINIC_OR_DEPARTMENT_OTHER): Payer: Self-pay | Admitting: *Deleted

## 2012-10-26 ENCOUNTER — Emergency Department (HOSPITAL_BASED_OUTPATIENT_CLINIC_OR_DEPARTMENT_OTHER)
Admission: EM | Admit: 2012-10-26 | Discharge: 2012-10-26 | Discharge status: Against Medical Advice | Payer: BC Managed Care – PPO | Attending: Emergency Medicine | Admitting: Emergency Medicine

## 2012-10-26 DIAGNOSIS — R109 Unspecified abdominal pain: Secondary | ICD-10-CM | POA: Insufficient documentation

## 2012-10-26 DIAGNOSIS — Y92410 Unspecified street and highway as the place of occurrence of the external cause: Secondary | ICD-10-CM | POA: Insufficient documentation

## 2012-10-26 DIAGNOSIS — IMO0002 Reserved for concepts with insufficient information to code with codable children: Secondary | ICD-10-CM | POA: Insufficient documentation

## 2012-10-26 DIAGNOSIS — Y93I9 Activity, other involving external motion: Secondary | ICD-10-CM | POA: Insufficient documentation
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
## 2012-10-26 NOTE — ED Notes (Signed)
Tired of waiting to be seen. Leaving. No distress.

## 2012-10-28 ENCOUNTER — Ambulatory Visit (INDEPENDENT_AMBULATORY_CARE_PROVIDER_SITE_OTHER): Payer: BC Managed Care – PPO | Admitting: Adult Health

## 2012-10-28 ENCOUNTER — Ambulatory Visit (INDEPENDENT_AMBULATORY_CARE_PROVIDER_SITE_OTHER)
Admission: RE | Admit: 2012-10-28 | Discharge: 2012-10-28 | Disposition: A | Discharge status: Home / Self Care | Payer: BC Managed Care – PPO | Source: Ambulatory Visit | Attending: Adult Health | Admitting: Adult Health

## 2012-10-28 ENCOUNTER — Encounter: Payer: Self-pay | Admitting: Adult Health

## 2012-10-28 DIAGNOSIS — J930 Spontaneous tension pneumothorax: Secondary | ICD-10-CM

## 2012-10-28 DIAGNOSIS — R0781 Pleurodynia: Secondary | ICD-10-CM

## 2012-10-28 MED ORDER — HYDROCODONE-ACETAMINOPHEN 5-500 MG PO TABS: 1.0000 | ORAL_TABLET | Freq: Three times a day (TID) | ORAL | Status: AC | PRN

## 2012-10-28 NOTE — Patient Instructions (Addendum)
Advil 200mg  3 tabs Twice daily  For 5 days -take w/ food  Alternate ice and heat to ribs Vicodin 1 every 8 hr As needed  For pain .  I will call for labs and xray  Please contact office for sooner follow up if symptoms do not improve or worsen or seek emergency care

## 2012-10-29 ENCOUNTER — Ambulatory Visit: Payer: BC Managed Care – PPO | Admitting: Adult Health

## 2012-10-29 ENCOUNTER — Telehealth: Payer: Self-pay | Admitting: Adult Health

## 2012-10-29 NOTE — Telephone Encounter (Signed)
Raymond [name] called today and spoke with pt about his cxr results

## 2012-10-29 NOTE — Progress Notes (Signed)
Quick Note:  Called spoke with patient, advised of cxr results / recs as stated by TP. Pt verbalized his understanding and denied any questions. ______ 

## 2012-11-01 DIAGNOSIS — R0781 Pleurodynia: Secondary | ICD-10-CM | POA: Insufficient documentation

## 2012-11-01 NOTE — Assessment & Plan Note (Signed)
Resolved on xray today  follow up Dr. Sherene Sires  In 1 month and As needed
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
## 2013-02-04 ENCOUNTER — Encounter: Payer: Self-pay | Admitting: Adult Health

## 2013-02-04 ENCOUNTER — Ambulatory Visit (INDEPENDENT_AMBULATORY_CARE_PROVIDER_SITE_OTHER): Payer: BC Managed Care – PPO | Admitting: Adult Health

## 2013-02-04 ENCOUNTER — Other Ambulatory Visit (INDEPENDENT_AMBULATORY_CARE_PROVIDER_SITE_OTHER): Payer: BC Managed Care – PPO

## 2013-02-04 ENCOUNTER — Telehealth: Payer: Self-pay | Admitting: Adult Health

## 2013-02-04 ENCOUNTER — Ambulatory Visit (INDEPENDENT_AMBULATORY_CARE_PROVIDER_SITE_OTHER)
Admission: RE | Admit: 2013-02-04 | Discharge: 2013-02-04 | Disposition: A | Discharge status: Home / Self Care | Payer: BC Managed Care – PPO | Source: Ambulatory Visit | Attending: Adult Health | Admitting: Adult Health

## 2013-02-04 VITALS — BP 104/78 | HR 84 | Temp 96.8°F | Ht 65.0 in | Wt 100.0 lb

## 2013-02-04 DIAGNOSIS — F419 Anxiety disorder, unspecified: Secondary | ICD-10-CM

## 2013-02-04 DIAGNOSIS — J930 Spontaneous tension pneumothorax: Secondary | ICD-10-CM

## 2013-02-04 DIAGNOSIS — R079 Chest pain, unspecified: Secondary | ICD-10-CM

## 2013-02-04 DIAGNOSIS — F411 Generalized anxiety disorder: Secondary | ICD-10-CM

## 2013-02-04 LAB — D-DIMER, QUANTITATIVE: D-Dimer, Quant: 0.27 ug/mL-FEU (ref 0.00–0.48)

## 2013-02-04 NOTE — Progress Notes (Signed)
Quick Note:  LMOM informing pt that available labs are normal and will call as they become available. ______

## 2013-02-04 NOTE — Telephone Encounter (Signed)
Attempted to call the pt back at the number given but no answer and no machine to leave a message.  Will try back later.

## 2013-02-04 NOTE — Telephone Encounter (Signed)
Pt called and spoke with emily and he has appt with TP at 10:15.  Nothing further is needed.

## 2013-02-04 NOTE — Patient Instructions (Addendum)
Advil 200mg  2 tabs Twice daily  For 5 days  Warm heat to chest wall I will call with labs  Begin Zoloft 50mg  daily .  Follow up with 4 weeks  Please contact office for sooner follow up if symptoms do not improve or worsen or seek emergency care

## 2013-02-05 LAB — ALPHA-1-ANTITRYPSIN: A-1 Antitrypsin, Ser: 115 mg/dL (ref 90–200)

## 2013-02-07 ENCOUNTER — Encounter: Payer: Self-pay | Admitting: Adult Health

## 2013-02-07 DIAGNOSIS — R079 Chest pain, unspecified: Secondary | ICD-10-CM | POA: Insufficient documentation

## 2013-02-07 NOTE — Assessment & Plan Note (Signed)
Stress reducers  Continue with counseling  Begin Zoloft 50mg  daily .  Follow up with 4 weeks  Please contact office for sooner follow up if symptoms do not improve or worsen or seek emergency care

## 2013-02-07 NOTE — Progress Notes (Signed)
Quick Note:  LMOM TCB x1. Needs to come in for a redraw on the A1AT, previous lab ordered did not have a phenotype. First lab will not be charged to patient. ______
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
## 2013-02-07 NOTE — Assessment & Plan Note (Signed)
Atypical chest pain  cxr w/ no acute process , EKG w/ no acute process.  Troponin neg.  ? Musculoskeletal in nature vs panic attack   Plan  Advil 200mg  2 tabs Twice daily  For 5 days  Warm heat to chest wall I will call with labs  Begin Zoloft 50mg  daily .  Follow up with 4 weeks  Please contact office for sooner follow up if symptoms do not improve or worsen or seek emergency care

## 2013-02-25 ENCOUNTER — Telehealth: Payer: Self-pay | Admitting: Adult Health

## 2013-02-25 NOTE — Telephone Encounter (Signed)
Spoke with pt and explained what Alpha 1 is and he verbalized understanding

## 2013-03-04 ENCOUNTER — Ambulatory Visit: Payer: BC Managed Care – PPO | Admitting: Adult Health

## 2013-12-16 ENCOUNTER — Emergency Department (HOSPITAL_BASED_OUTPATIENT_CLINIC_OR_DEPARTMENT_OTHER): Payer: PRIVATE HEALTH INSURANCE

## 2013-12-16 ENCOUNTER — Encounter (HOSPITAL_COMMUNITY): Payer: PRIVATE HEALTH INSURANCE | Admitting: Certified Registered Nurse Anesthetist

## 2013-12-16 ENCOUNTER — Ambulatory Visit (HOSPITAL_BASED_OUTPATIENT_CLINIC_OR_DEPARTMENT_OTHER)
Admission: EM | Admit: 2013-12-16 | Discharge: 2013-12-17 | Disposition: A | Discharge status: Home / Self Care | Payer: PRIVATE HEALTH INSURANCE | Attending: General Surgery | Admitting: General Surgery

## 2013-12-16 ENCOUNTER — Encounter (HOSPITAL_BASED_OUTPATIENT_CLINIC_OR_DEPARTMENT_OTHER): Payer: Self-pay | Admitting: Emergency Medicine

## 2013-12-16 ENCOUNTER — Emergency Department (HOSPITAL_COMMUNITY): Payer: PRIVATE HEALTH INSURANCE | Admitting: Certified Registered Nurse Anesthetist

## 2013-12-16 ENCOUNTER — Encounter (HOSPITAL_COMMUNITY)
Admission: EM | Disposition: A | Discharge status: Home / Self Care | Payer: Self-pay | Source: Home / Self Care | Attending: Emergency Medicine

## 2013-12-16 DIAGNOSIS — K3580 Unspecified acute appendicitis: Secondary | ICD-10-CM

## 2013-12-16 DIAGNOSIS — F988 Other specified behavioral and emotional disorders with onset usually occurring in childhood and adolescence: Secondary | ICD-10-CM | POA: Insufficient documentation

## 2013-12-16 DIAGNOSIS — Z79899 Other long term (current) drug therapy: Secondary | ICD-10-CM | POA: Insufficient documentation

## 2013-12-16 DIAGNOSIS — K37 Unspecified appendicitis: Secondary | ICD-10-CM

## 2013-12-16 DIAGNOSIS — Z87891 Personal history of nicotine dependence: Secondary | ICD-10-CM | POA: Insufficient documentation

## 2013-12-16 HISTORY — PX: LAPAROSCOPIC APPENDECTOMY: SHX408

## 2013-12-16 LAB — URINALYSIS, ROUTINE W REFLEX MICROSCOPIC
Bilirubin Urine: NEGATIVE
Glucose, UA: NEGATIVE mg/dL
Hgb urine dipstick: NEGATIVE
Ketones, ur: 40 mg/dL — AB
Leukocytes, UA: NEGATIVE
pH: 8 (ref 5.0–8.0)

## 2013-12-16 LAB — COMPREHENSIVE METABOLIC PANEL
ALT: 14 U/L (ref 0–53)
AST: 23 U/L (ref 0–37)
Albumin: 5.5 g/dL — ABNORMAL HIGH (ref 3.5–5.2)
Alkaline Phosphatase: 46 U/L (ref 39–117)
CO2: 19 mEq/L (ref 19–32)
Chloride: 99 mEq/L (ref 96–112)
Creatinine, Ser: 0.9 mg/dL (ref 0.50–1.35)
GFR calc non Af Amer: >90 mL/min (ref >90)
Potassium: 3.1 mEq/L — ABNORMAL LOW (ref 3.5–5.1)
Sodium: 139 mEq/L (ref 135–145)
Total Bilirubin: 1.1 mg/dL (ref 0.3–1.2)

## 2013-12-16 LAB — CBC WITH DIFFERENTIAL/PLATELET
Basophils Absolute: 0 10*3/uL (ref 0.0–0.1)
Basophils Relative: 0 % (ref 0–1)
Eosinophils Absolute: 0 10*3/uL (ref 0.0–0.7)
HCT: 44 % (ref 39.0–52.0)
Lymphocytes Relative: 5 % — ABNORMAL LOW (ref 12–46)
MCHC: 35.9 g/dL (ref 30.0–36.0)
MCV: 88 fL (ref 78.0–100.0)
Monocytes Absolute: 1.6 10*3/uL — ABNORMAL HIGH (ref 0.1–1.0)
Neutro Abs: 21 10*3/uL — ABNORMAL HIGH (ref 1.7–7.7)
Neutrophils Relative %: 88 % — ABNORMAL HIGH (ref 43–77)
RDW: 12 % (ref 11.5–15.5)
WBC: 23.8 10*3/uL — ABNORMAL HIGH (ref 4.0–10.5)

## 2013-12-16 SURGERY — APPENDECTOMY, LAPAROSCOPIC
Anesthesia: General | Site: Abdomen

## 2013-12-16 MED ORDER — MORPHINE SULFATE 4 MG/ML IJ SOLN
4.0000 mg | Freq: Once | INTRAMUSCULAR | Status: AC
  Administered 2013-12-16: 4 mg via INTRAVENOUS
  Filled 2013-12-16: qty 1

## 2013-12-16 MED ORDER — ONDANSETRON HCL 4 MG PO TABS: 4.0000 mg | ORAL_TABLET | Freq: Four times a day (QID) | ORAL | Status: DC | PRN

## 2013-12-16 MED ORDER — LACTATED RINGERS IV SOLN: INTRAVENOUS | Status: DC

## 2013-12-16 MED ORDER — IOHEXOL 300 MG/ML  SOLN
50.0000 mL | Freq: Once | INTRAMUSCULAR | Status: AC | PRN
  Administered 2013-12-16: 50 mL via ORAL

## 2013-12-16 MED ORDER — HYDROCODONE-ACETAMINOPHEN 5-325 MG PO TABS
1.0000 | ORAL_TABLET | ORAL | Status: DC | PRN
  Administered 2013-12-16 – 2013-12-17 (×2): 1 via ORAL
  Filled 2013-12-16 (×2): qty 2

## 2013-12-16 MED ORDER — PROMETHAZINE HCL 25 MG/ML IJ SOLN: 6.2500 mg | INTRAMUSCULAR | Status: DC | PRN

## 2013-12-16 MED ORDER — SUCCINYLCHOLINE CHLORIDE 20 MG/ML IJ SOLN
INTRAMUSCULAR | Status: AC
  Filled 2013-12-16: qty 1

## 2013-12-16 MED ORDER — MIDAZOLAM HCL 2 MG/2ML IJ SOLN
INTRAMUSCULAR | Status: AC
  Filled 2013-12-16: qty 2

## 2013-12-16 MED ORDER — METOCLOPRAMIDE HCL 5 MG/ML IJ SOLN
10.0000 mg | Freq: Once | INTRAMUSCULAR | Status: AC
  Administered 2013-12-16: 10 mg via INTRAVENOUS
  Filled 2013-12-16: qty 2

## 2013-12-16 MED ORDER — ONDANSETRON HCL 4 MG/2ML IJ SOLN
INTRAMUSCULAR | Status: DC | PRN
  Administered 2013-12-16: 4 mg via INTRAVENOUS

## 2013-12-16 MED ORDER — LACTATED RINGERS IV SOLN
INTRAVENOUS | Status: DC | PRN
  Administered 2013-12-16: 17:00:00 via INTRAVENOUS

## 2013-12-16 MED ORDER — BUPIVACAINE HCL (PF) 0.25 % IJ SOLN
INTRAMUSCULAR | Status: AC
  Filled 2013-12-16: qty 30

## 2013-12-16 MED ORDER — EPHEDRINE SULFATE 50 MG/ML IJ SOLN
INTRAMUSCULAR | Status: DC | PRN
  Administered 2013-12-16: 5 mg via INTRAVENOUS

## 2013-12-16 MED ORDER — ONDANSETRON HCL 4 MG/2ML IJ SOLN
4.0000 mg | Freq: Once | INTRAMUSCULAR | Status: AC
  Administered 2013-12-16: 4 mg via INTRAVENOUS
  Filled 2013-12-16: qty 2

## 2013-12-16 MED ORDER — IOHEXOL 300 MG/ML  SOLN
100.0000 mL | Freq: Once | INTRAMUSCULAR | Status: AC | PRN
  Administered 2013-12-16: 100 mL via INTRAVENOUS

## 2013-12-16 MED ORDER — BUPIVACAINE HCL (PF) 0.25 % IJ SOLN
INTRAMUSCULAR | Status: DC | PRN
  Administered 2013-12-16: 30 mL

## 2013-12-16 MED ORDER — ROCURONIUM BROMIDE 100 MG/10ML IV SOLN
INTRAVENOUS | Status: DC | PRN
  Administered 2013-12-16: 30 mg via INTRAVENOUS

## 2013-12-16 MED ORDER — NEOSTIGMINE METHYLSULFATE 1 MG/ML IJ SOLN
INTRAMUSCULAR | Status: AC
  Filled 2013-12-16: qty 10

## 2013-12-16 MED ORDER — ACETAMINOPHEN 10 MG/ML IV SOLN
1000.0000 mg | Freq: Once | INTRAVENOUS | Status: DC
  Filled 2013-12-16 (×2): qty 100

## 2013-12-16 MED ORDER — ACETAMINOPHEN 325 MG PO TABS
650.0000 mg | ORAL_TABLET | Freq: Once | ORAL | Status: AC
  Administered 2013-12-16: 650 mg via ORAL

## 2013-12-16 MED ORDER — OXYCODONE HCL 5 MG/5ML PO SOLN: 5.0000 mg | Freq: Once | ORAL | Status: DC | PRN

## 2013-12-16 MED ORDER — NEOSTIGMINE METHYLSULFATE 1 MG/ML IJ SOLN
INTRAMUSCULAR | Status: DC | PRN
  Administered 2013-12-16: 3 mg via INTRAVENOUS

## 2013-12-16 MED ORDER — FENTANYL CITRATE 0.05 MG/ML IJ SOLN
INTRAMUSCULAR | Status: AC
  Filled 2013-12-16: qty 5

## 2013-12-16 MED ORDER — DEXTROSE 5 % IV SOLN
INTRAVENOUS | Status: AC
  Filled 2013-12-16: qty 1

## 2013-12-16 MED ORDER — ACETAMINOPHEN 10 MG/ML IV SOLN
INTRAVENOUS | Status: DC | PRN
  Administered 2013-12-16: 1000 mg via INTRAVENOUS

## 2013-12-16 MED ORDER — LIDOCAINE HCL (PF) 2 % IJ SOLN
INTRAMUSCULAR | Status: DC | PRN
  Administered 2013-12-16: 50 mg via INTRADERMAL

## 2013-12-16 MED ORDER — LORAZEPAM 2 MG/ML IJ SOLN
1.0000 mg | Freq: Once | INTRAMUSCULAR | Status: AC
  Administered 2013-12-16: 1 mg via INTRAVENOUS
  Filled 2013-12-16: qty 1

## 2013-12-16 MED ORDER — LACTATED RINGERS IR SOLN
Status: DC | PRN
  Administered 2013-12-16: 1000 mL

## 2013-12-16 MED ORDER — HYDROMORPHONE HCL PF 1 MG/ML IJ SOLN: 0.2500 mg | INTRAMUSCULAR | Status: DC | PRN

## 2013-12-16 MED ORDER — GLYCOPYRROLATE 0.2 MG/ML IJ SOLN
INTRAMUSCULAR | Status: AC
  Filled 2013-12-16: qty 2

## 2013-12-16 MED ORDER — MIDAZOLAM HCL 5 MG/5ML IJ SOLN
INTRAMUSCULAR | Status: DC | PRN
  Administered 2013-12-16: 2 mg via INTRAVENOUS

## 2013-12-16 MED ORDER — FENTANYL CITRATE 0.05 MG/ML IJ SOLN
INTRAMUSCULAR | Status: DC | PRN
  Administered 2013-12-16: 50 ug via INTRAVENOUS
  Administered 2013-12-16: 100 ug via INTRAVENOUS
  Administered 2013-12-16 (×2): 50 ug via INTRAVENOUS

## 2013-12-16 MED ORDER — SUCCINYLCHOLINE CHLORIDE 20 MG/ML IJ SOLN
INTRAMUSCULAR | Status: DC | PRN
  Administered 2013-12-16: 100 mg via INTRAVENOUS

## 2013-12-16 MED ORDER — DEXTROSE 5 % IV SOLN
2.0000 g | Freq: Once | INTRAVENOUS | Status: AC
  Administered 2013-12-16: 2 g via INTRAVENOUS
  Filled 2013-12-16: qty 2

## 2013-12-16 MED ORDER — ACETAMINOPHEN 325 MG PO TABS
ORAL_TABLET | ORAL | Status: AC
  Filled 2013-12-16: qty 2

## 2013-12-16 MED ORDER — EPHEDRINE SULFATE 50 MG/ML IJ SOLN
INTRAMUSCULAR | Status: AC
  Filled 2013-12-16: qty 1

## 2013-12-16 MED ORDER — KCL IN DEXTROSE-NACL 20-5-0.45 MEQ/L-%-% IV SOLN
INTRAVENOUS | Status: DC
  Administered 2013-12-16: 21:00:00 via INTRAVENOUS
  Filled 2013-12-16 (×3): qty 1000

## 2013-12-16 MED ORDER — IBUPROFEN 600 MG PO TABS
600.0000 mg | ORAL_TABLET | Freq: Four times a day (QID) | ORAL | Status: DC | PRN
  Administered 2013-12-17: 600 mg via ORAL
  Filled 2013-12-16: qty 1

## 2013-12-16 MED ORDER — ONDANSETRON HCL 4 MG/2ML IJ SOLN
INTRAMUSCULAR | Status: AC
  Filled 2013-12-16: qty 2

## 2013-12-16 MED ORDER — MEPERIDINE HCL 50 MG/ML IJ SOLN: 6.2500 mg | INTRAMUSCULAR | Status: DC | PRN

## 2013-12-16 MED ORDER — 0.9 % SODIUM CHLORIDE (POUR BTL) OPTIME
TOPICAL | Status: DC | PRN
  Administered 2013-12-16: 1000 mL

## 2013-12-16 MED ORDER — PROPOFOL 10 MG/ML IV BOLUS
INTRAVENOUS | Status: AC
  Filled 2013-12-16: qty 20

## 2013-12-16 MED ORDER — GLYCOPYRROLATE 0.2 MG/ML IJ SOLN
INTRAMUSCULAR | Status: DC | PRN
  Administered 2013-12-16: 0.4 mg via INTRAVENOUS

## 2013-12-16 MED ORDER — MORPHINE SULFATE 2 MG/ML IJ SOLN: 1.0000 mg | INTRAMUSCULAR | Status: DC | PRN

## 2013-12-16 MED ORDER — ROCURONIUM BROMIDE 100 MG/10ML IV SOLN
INTRAVENOUS | Status: AC
  Filled 2013-12-16: qty 1

## 2013-12-16 MED ORDER — SODIUM CHLORIDE 0.9 % IV BOLUS (SEPSIS)
1000.0000 mL | Freq: Once | INTRAVENOUS | Status: AC
  Administered 2013-12-16: 1000 mL via INTRAVENOUS

## 2013-12-16 MED ORDER — OXYCODONE HCL 5 MG PO TABS: 5.0000 mg | ORAL_TABLET | Freq: Once | ORAL | Status: DC | PRN

## 2013-12-16 MED ORDER — PROPOFOL 10 MG/ML IV BOLUS
INTRAVENOUS | Status: DC | PRN
  Administered 2013-12-16: 120 mg via INTRAVENOUS

## 2013-12-16 MED ORDER — HEPARIN SODIUM (PORCINE) 5000 UNIT/ML IJ SOLN
5000.0000 [IU] | Freq: Three times a day (TID) | INTRAMUSCULAR | Status: DC
  Administered 2013-12-17: 5000 [IU] via SUBCUTANEOUS
  Filled 2013-12-16 (×4): qty 1

## 2013-12-16 MED ORDER — ONDANSETRON HCL 4 MG/2ML IJ SOLN: 4.0000 mg | Freq: Four times a day (QID) | INTRAMUSCULAR | Status: DC | PRN

## 2013-12-16 MED ORDER — DEXTROSE 5 % IV SOLN
2.0000 g | Freq: Four times a day (QID) | INTRAVENOUS | Status: DC
  Administered 2013-12-16 – 2013-12-17 (×3): 2 g via INTRAVENOUS
  Filled 2013-12-16 (×5): qty 2

## 2013-12-16 SURGICAL SUPPLY — 45 items
ADH SKN CLS APL DERMABOND .7 (GAUZE/BANDAGES/DRESSINGS)
APL SKNCLS STERI-STRIP NONHPOA (GAUZE/BANDAGES/DRESSINGS) ×1
APPLIER CLIP ROT 10 11.4 M/L (STAPLE)
APR CLP MED LRG 11.4X10 (STAPLE)
BAG SPEC RTRVL LRG 6X4 10 (ENDOMECHANICALS) ×1
BENZOIN TINCTURE PRP APPL 2/3 (GAUZE/BANDAGES/DRESSINGS) ×2 IMPLANT
CANISTER SUCTION 2500CC (MISCELLANEOUS) ×2 IMPLANT
CLIP APPLIE ROT 10 11.4 M/L (STAPLE) IMPLANT
CUTTER FLEX LINEAR 45M (STAPLE) ×1 IMPLANT
DECANTER SPIKE VIAL GLASS SM (MISCELLANEOUS) ×1 IMPLANT
DERMABOND ADVANCED (GAUZE/BANDAGES/DRESSINGS)
DERMABOND ADVANCED .7 DNX12 (GAUZE/BANDAGES/DRESSINGS) IMPLANT
DRAPE LAPAROSCOPIC ABDOMINAL (DRAPES) ×2 IMPLANT
ELECT REM PT RETURN 9FT ADLT (ELECTROSURGICAL) ×2
ELECTRODE REM PT RTRN 9FT ADLT (ELECTROSURGICAL) ×1 IMPLANT
ENDOLOOP SUT PDS II  0 18 (SUTURE)
ENDOLOOP SUT PDS II 0 18 (SUTURE) IMPLANT
GLOVE BIOGEL PI IND STRL 7.0 (GLOVE) ×1 IMPLANT
GLOVE BIOGEL PI INDICATOR 7.0 (GLOVE) ×1
GLOVE SURG SIGNA 7.5 PF LTX (GLOVE) ×2 IMPLANT
GOWN PREVENTION PLUS LG XLONG (DISPOSABLE) ×2 IMPLANT
GOWN STRL REIN XL XLG (GOWN DISPOSABLE) ×4 IMPLANT
KIT BASIN OR (CUSTOM PROCEDURE TRAY) ×2 IMPLANT
PENCIL BUTTON HOLSTER BLD 10FT (ELECTRODE) IMPLANT
POUCH SPECIMEN RETRIEVAL 10MM (ENDOMECHANICALS) ×2 IMPLANT
RELOAD 45 VASCULAR/THIN (ENDOMECHANICALS) IMPLANT
RELOAD STAPLE 45 2.5 WHT GRN (ENDOMECHANICALS) IMPLANT
RELOAD STAPLE 45 3.5 BLU ETS (ENDOMECHANICALS) IMPLANT
RELOAD STAPLE TA45 3.5 REG BLU (ENDOMECHANICALS) ×2 IMPLANT
SCALPEL HARMONIC ACE (MISCELLANEOUS) ×2 IMPLANT
SET IRRIG TUBING LAPAROSCOPIC (IRRIGATION / IRRIGATOR) ×2 IMPLANT
SLEEVE ADV FIXATION 5X100MM (TROCAR) ×1 IMPLANT
SOLUTION ANTI FOG 6CC (MISCELLANEOUS) ×2 IMPLANT
STRIP CLOSURE SKIN 1/2X4 (GAUZE/BANDAGES/DRESSINGS) ×2 IMPLANT
SUT MON AB 5-0 PS2 18 (SUTURE) ×2 IMPLANT
TOWEL OR 17X26 10 PK STRL BLUE (TOWEL DISPOSABLE) ×2 IMPLANT
TOWEL OR NON WOVEN STRL DISP B (DISPOSABLE) ×2 IMPLANT
TRAY FOLEY CATH 14FRSI W/METER (CATHETERS) ×1 IMPLANT
TRAY FOLEY CATH 16FR SILVER (SET/KITS/TRAYS/PACK) ×1 IMPLANT
TRAY LAP CHOLE (CUSTOM PROCEDURE TRAY) ×2 IMPLANT
TROCAR ADV FIXATION 5X100MM (TROCAR) ×1 IMPLANT
TROCAR BLADELESS OPT 5 100 (ENDOMECHANICALS) ×2 IMPLANT
TROCAR XCEL BLUNT TIP 100MML (ENDOMECHANICALS) ×2 IMPLANT
TROCAR XCEL NON-BLD 11X100MML (ENDOMECHANICALS) ×2 IMPLANT
TUBING INSUFFLATION 10FT LAP (TUBING) ×2 IMPLANT

## 2013-12-16 NOTE — Preoperative (Signed)
Beta Blockers   Reason not to administer Beta Blockers:Not Applicable 

## 2013-12-16 NOTE — Transfer of Care (Signed)
Immediate Anesthesia Transfer of Care Note  Patient: Raymond [name]  Procedure(s) Performed: Procedure(s) (LRB): APPENDECTOMY LAPAROSCOPIC (N/A)  Patient Location: PACU  Anesthesia Type: General  Level of Consciousness: sedated, patient cooperative and responds to stimulation  Airway & Oxygen Therapy: Patient Spontanous Breathing and Patient connected to face mask oxgen  Post-op Assessment: Report given to PACU RN and Post -op Vital signs reviewed and stable  Post vital signs: Reviewed and stable  Complications: No apparent anesthesia complications

## 2013-12-16 NOTE — H&P (Signed)
Re:   Raymond [name] DOB:   [birth year] MRN:   [id number]  [facility] Admission  ASSESSMENT AND PLAN: 1.  Acute appendicitis  I discussed with the patient the indications and risks of appendiceal surgery.  The primary risks of appendiceal surgery include, but are not limited to, bleeding, infection, bowel surgery, and open surgery.  There is also the risk that the patient may have continued symptoms after surgery.  However, the likelihood of improvement in symptoms and return to the patient's normal status is good. We discussed the typical post-operative recovery course. I tried to answer the patient's questions.  2.  ADD  No taking meds 3.  History of spontaneous pneumothoraces  Last one Nov 2013.  Usually treated expectantly.  Chief Complaint  Patient presents with  . Abdominal Pain   REFERRING PHYSICIAN: Sandrea Hughs, MD  HISTORY OF PRESENT ILLNESS: Raymond [name] is a 27 y.o. (DOB: 1986/07/07)  white  male whose primary care physician is Sandrea Hughs, MD and comes to [facility] ER with appendicitis.  The patient has no history of GI problems.  He started having periumbilical pain around 9PM last night.  He vomited several times.  He then came to [facility] and was evaluated.  Dr. Blinda Leatherwood called me about the patient. He's had no prior abdominal surgery.  CT scan - 12/17/2103 - Dilated appendix without significant inflammatory changes this may represent some very early appendicitis.   Past Medical History  Diagnosis Date  . ADD (attention deficit disorder)   . Pneumothorax     Past Surgical History  Procedure Laterality Date  . Eye surgery      numerous eye surgeries      Current Facility-Administered Medications  Medication Dose Route Frequency Provider Last Rate Last Dose  . acetaminophen (TYLENOL) 325 MG tablet            Current Outpatient Prescriptions  Medication Sig Dispense Refill  . amphetamine-dextroamphetamine (ADDERALL XR) 20 MG 24 hr capsule Take 20 mg by  mouth daily as needed.        . clorazepate (TRANXENE) 15 MG tablet Take 1 tab by mouth four times daily or as needed         No Known Allergies  REVIEW OF SYSTEMS: Skin:  No history of rash.  No history of abnormal moles. Infection:  No history of hepatitis or HIV.  No history of MRSA. Neurologic:  No history of stroke.  No history of seizure.  No history of headaches. Cardiac:  No history of hypertension. No history of heart disease.  No history of prior cardiac catheterization.  No history of seeing a cardiologist. Pulmonary: History of spontaneous pneumothoraces.  Saw Dr Idamae Lusher a long time ago.  Last one Nov 2013.  Usually treated expectantly.  Endocrine:  No diabetes. No thyroid disease. Gastrointestinal:  No history of stomach disease.  No history of liver disease.  No history of gall bladder disease.  No history of pancreas disease.  No history of colon disease. Urologic:  No history of kidney stones.  No history of bladder infections. Musculoskeletal:  No history of joint or back disease. Hematologic:  No bleeding disorder.  No history of anemia.  Not anticoagulated. Psycho-social:  The patient is oriented.   History of ADD.  But stopped meds as of March 2014.  Has anxiety/stress for which he takes occasional lorazepam.  SOCIAL and FAMILY HISTORY: Single. Works for [employer].  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 Parents in the room.  Father, Burlin Mcnair, phone:  [phone].  PHYSICAL EXAM: BP 98/56  Pulse 88  Temp(Src) 101 F (38.3 C) (Oral)  Resp 18  SpO2 100%  General: WN thin WM who is alert and generally healthy appearing.  HEENT: Normal. Pupils equal. Neck: Supple. No mass.  No thyroid mass. Lymph Nodes:  No supraclavicular or cervical nodes. Lungs: Clear to auscultation and symmetric breath sounds. Heart:  RRR. No murmur or rub. Abdomen: Soft. No mass.  No hernia. Normal bowel sounds.  No abdominal scars. Tender lower abdomen, right  slightly worse than left.  No guarding or rebound. Rectal: Not done. Extremities:  Good strength and ROM  in upper and lower extremities. Neurologic:  Grossly intact to motor and sensory function. Psychiatric: Has normal mood and affect. Behavior is normal.   DATA REVIEWED: WBC - 23,800 - 12/16/2013  Ovidio Kin, MD,  [facility] [address] [place]  [address]   [place], [place]    [address] Phone:  [phone] FAX:  [phone]

## 2013-12-16 NOTE — Anesthesia Postprocedure Evaluation (Signed)
Anesthesia Post Note  Patient: Raymond [name]  Procedure(s) Performed: Procedure(s) (LRB): APPENDECTOMY LAPAROSCOPIC (N/A)  Anesthesia type: General  Patient location: PACU  Post pain: Pain level controlled  Post assessment: Post-op Vital signs reviewed  Last Vitals: BP 115/82  Pulse 82  Temp(Src) 36.7 C (Oral)  Resp 16  Ht 5\' 6"  (1.676 m)  Wt 107 lb (48.535 kg)  BMI 17.28 kg/m2  SpO2 98%  Post vital signs: Reviewed  Level of consciousness: sedated  Complications: No apparent anesthesia complications

## 2013-12-16 NOTE — Op Note (Signed)
Re:   Raymond [name] DOB:   [birth year] MRN:   [id number]                   FACILITY:  [facility]  DATE OF PROCEDURE: 12/16/2013                              OPERATIVE REPORT  PREOPERATIVE DIAGNOSIS:  Appendicitis  POSTOPERATIVE DIAGNOSIS:  Acute suppurative gangrenous appendicitis.  PROCEDURE:  Laparoscopic appendectomy.  SURGEON:  Sandria Bales. Ezzard Standing, MD  ASSISTANT:  No first assistant.  ANESTHESIA:  General endotracheal.  Anesthesiologist: Gaylan Gerold, MD CRNA: Vista Lawman, CRNA  ASA:  2E  ESTIMATED BLOOD LOSS:  Minimal.  DRAINS: none   SPECIMEN:   Appendix  COUNTS CORRECT:  YES  INDICATIONS FOR PROCEDURE: Raymond [name] is a 27 y.o. (DOB: 30-Sep-1986) white  male whose primary care doctor is Sandrea Hughs, MD and comes to the OR for an appendectomy.   I discussed with the patient, the indications and potential complications of appendiceal surgery.  The potential complications include, but are not limited to, bleeding, open surgery, bowel resection, and the possibility of another diagnosis.  OPERATIVE NOTE:  The patient underwent a general endotracheal anesthetic as supervised by Anesthesiologist: Gaylan Gerold, MD CRNA: Vista Lawman, CRNA, General, in room #1 at [facility]  The patient had been given 2 g of cefoxitin 4 hours pre op and was redosed with 1 g of cefoxitin at the beginning of the procedure and the abdomen was prepped with ChloraPrep.  The patient had a foley catheter placed at the beginning of the procedure.  A time-out was held and surgical checklist run.  An infraumbilical incision was made with sharp dissection carried down to the abdominal cavity.  An 12 mm Hasson trocar was inserted through the infraumbilical incision and into the peritoneal cavity.  A 0 degree 10 mm laparoscope was inserted through a 12 mm Hasson trocar and the Hasson trocar secured with a 0 Vicryl suture.  I placed a 5 mm balloon trocar in the right upper quadrant and 5 mm balloon torcar in  left lower quadrant and did abdominal exploration.    The right and left lobes of liver unremarkable.  The patient had a hole in his falciform ligament.  A photo was taken.  Stomach was unremarkable.  The pelvic organs were unremarkable.  I saw no other intra-abdominal abnormality.  The patient had appendicitis with the appendix located right pelvic brim.  The appendix was large for the patient's size, it was purulent and gangrenous.  The mesentery of the appendix was divided with a Harmonic scalpel.  I took a photo of the appendix before dividing it.  I got to the base of the appendix.  I then used a blue load 45 mm Ethicon Endo-GIA stapler and fired this across the base of the appendix.  I placed the appendix in EndoCatch bag and delivered the bag through the umbilical incision.  I irrigated the abdomen with 500 cc of saline.  After irrigating the abdomen, I then removed the trocars, in turn.  The umbilical port fascia was closed with 0 Vicryl suture.   I closed the skin each site with a 5-0 Monocryl suture and painted the wounds with Dermabond.  I then injected a total of 20 mL of 0.25% Marcaine at the incisions.  Sponge and needle count were correct at the end of the case.  The foley catheter was removed in the OR.  The patient was transferred to the recovery room in good condition.  The patient tolerated the procedure well and it depends on the patient's post op clinical course as to when the patient could be discharged.   Ovidio Kin, MD, [facility] Surgery Pager: [phone] Office phone:  [phone]

## 2013-12-16 NOTE — ED Notes (Signed)
Bed: QM57 Expected date:  Expected time:  Means of arrival:  Comments: Transfer from [facility]

## 2013-12-16 NOTE — Anesthesia Preprocedure Evaluation (Addendum)
Anesthesia Evaluation  Patient identified by MRN, date of birth, ID band Patient awake    Reviewed: Allergy & Precautions, H&P , NPO status , Patient's Chart, lab work & pertinent test results  Airway Mallampati: II TM Distance: >3 FB Neck ROM: Full    Dental  (+) Dental Advisory Given   Pulmonary neg pulmonary ROS, former smoker,  breath sounds clear to auscultation        Cardiovascular negative cardio ROS  Rhythm:Regular Rate:Normal     Neuro/Psych PSYCHIATRIC DISORDERS Anxiety negative neurological ROS     GI/Hepatic negative GI ROS, Neg liver ROS,   Endo/Other  negative endocrine ROS  Renal/GU negative Renal ROS     Musculoskeletal negative musculoskeletal ROS (+)   Abdominal   Peds  Hematology negative hematology ROS (+)   Anesthesia Other Findings   Reproductive/Obstetrics                         Anesthesia Physical Anesthesia Plan  ASA: I and emergent  Anesthesia Plan: General   Post-op Pain Management:    Induction: Intravenous, Rapid sequence and Cricoid pressure planned  Airway Management Planned: Oral ETT  Additional Equipment:   Intra-op Plan:   Post-operative Plan: Extubation in OR  Informed Consent: I have reviewed the patients History and Physical, chart, labs and discussed the procedure including the risks, benefits and alternatives for the proposed anesthesia with the patient or authorized representative who has indicated his/her understanding and acceptance.   Dental advisory given  Plan Discussed with: CRNA  Anesthesia Plan Comments:         Anesthesia Quick Evaluation

## 2013-12-16 NOTE — ED Notes (Signed)
Pt to room 9 with parents, reporting chills, abd pain, nausea x this am.

## 2013-12-16 NOTE — ED Notes (Signed)
Pt states "I just need pain medicine, I don't need any of that other medicine!" explained to pt that we are giving him medication for nausea and anxiety and that may help the pain.

## 2013-12-16 NOTE — ED Notes (Signed)
Per [facility], Pt went to [facility] for N/V, abdominal pain since last night. WBC - 23.8. And CT shows dilated appendix.Antibxfinished on the way over. Pt. Had tylenol about 35 minutes ago. 20gin R forearm.

## 2013-12-16 NOTE — ED Provider Notes (Signed)
CSN: [id number]     Arrival date & time 12/16/13  7829 History   First MD Initiated Contact with Patient 12/16/13 0749     No chief complaint on file.  (Consider location/radiation/quality/duration/timing/severity/associated sxs/prior Treatment) HPI Comments: Presents to the ER for evaluation of multiple complaints. Patient started last night with nausea and vomiting after eating dinner. He has had diffuse abdominal cramping. There has not been any fever or diarrhea. Vomiting has continued throughout the night. This morning the patient has started to feel short of breath. He is extremely anxious. Patient's respiratory rate is up and now is experiencing numbness and tingling in his hands and around his mouth.   Past Medical History  Diagnosis Date  . ADD (attention deficit disorder)   . Pneumothorax    No past surgical history on file. Family History  Problem Relation Age of Onset  . Asthma Sister    History  Substance Use Topics  . Smoking status: Former Smoker -- 1 years    Types: Cigarettes    Quit date: 09/01/2012  . Smokeless tobacco: Not on file     Comment: social smoker > "e-cig"  . Alcohol Use: Yes    Review of Systems  Respiratory: Positive for shortness of breath.   Gastrointestinal: Positive for nausea, vomiting and abdominal pain.  All other systems reviewed and are negative.    Allergies  Review of patient's allergies indicates no known allergies.  Home Medications   Current Outpatient Rx  Name  Route  Sig  Dispense  Refill  . amphetamine-dextroamphetamine (ADDERALL XR) 20 MG 24 hr capsule   Oral   Take 20 mg by mouth daily as needed.           . clorazepate (TRANXENE) 15 MG tablet      Take 1 tab by mouth four times daily or as needed          There were no vitals taken for this visit. Physical Exam  Constitutional: He is oriented to person, place, and time. He appears well-developed and well-nourished. He appears distressed.  HENT:  Head:  Normocephalic and atraumatic.  Right Ear: Hearing normal.  Left Ear: Hearing normal.  Nose: Nose normal.  Mouth/Throat: Oropharynx is clear and moist and mucous membranes are normal.  Eyes: Conjunctivae and EOM are normal. Pupils are equal, round, and reactive to light.  Neck: Normal range of motion. Neck supple.  Cardiovascular: Regular rhythm, S1 normal and S2 normal.  Exam reveals no gallop and no friction rub.   No murmur heard. Pulmonary/Chest: Breath sounds normal. Tachypnea noted. No respiratory distress. He exhibits no tenderness.  Abdominal: Soft. Normal appearance and bowel sounds are normal. There is no hepatosplenomegaly. There is generalized tenderness. There is no rebound, no guarding, no tenderness at McBurney's point and negative Murphy's sign. No hernia.  Musculoskeletal: Normal range of motion.  Neurological: He is alert and oriented to person, place, and time. He has normal strength. No cranial nerve deficit or sensory deficit. Coordination normal. GCS eye subscore is 4. GCS verbal subscore is 5. GCS motor subscore is 6.  Skin: Skin is warm, dry and intact. No rash noted. No cyanosis.  Psychiatric: His speech is normal and behavior is normal. Thought content normal. His mood appears anxious.    ED Course  Procedures (including critical care time) Labs Review Labs Reviewed  CBC WITH DIFFERENTIAL  COMPREHENSIVE METABOLIC PANEL  LIPASE, BLOOD  URINALYSIS, ROUTINE W REFLEX MICROSCOPIC   Imaging Review No results found.  EKG Interpretation    Date/Time:  Friday December 16 2013 07:52:18 EST Ventricular Rate:  98 PR Interval:  154 QRS Duration: 84 QT Interval:  364 QTC Calculation: 464 R Axis:   82 Text Interpretation:  Normal sinus rhythm Normal ECG Confirmed by POLLINA  MD, CHRISTOPHER ([id number]) on 12/16/2013 7:57:06 AM            MDM  Diagnosis: Abdominal pain, possible early appendicitis  The patient presents to the ER for evaluation of abdominal pain  which began last night. He has had chills, but no documented fever. Patient's pain has worsened overnight and into this morning. Arrival, patient is hyperventilating, extremely anxious. Examination was difficult arrival due to the level of his anxiety.  Patient anxiety and hyperventilation was treated with Ativan. Patient also administered IV fluids and Zofran. He required additional antiemetic (reglan) to drink the contrast for CAT scan and was given morphine pain. After this he became much more calm and repeat examination revealed tenderness with slight voluntary guarding in the right lower quadrant.  Blood Work reveals significant leukocytosis. CT scan was performed to further evaluate. CT scan shows dilated appendix with fluid and denser material within. The are no inflammatory changes noted, but this can still represent early appendicitis. Based on the examination, this is a concern. General surgery therefore consulted. Patient was sent to is a lot about the department and Doctor Ezzard Standing will evaluate the patient.     Gilda Crease, MD 12/16/13 1336

## 2013-12-17 MED ORDER — HYDROCODONE-ACETAMINOPHEN 5-325 MG PO TABS: 1.0000 | ORAL_TABLET | ORAL | Status: DC | PRN

## 2013-12-17 NOTE — Progress Notes (Signed)
Assessment unchanged. Ambulating in room and hall this am. Pt and mother verbalized understanding of dc instructions through teach back. Script x 1 given to pt as provided by MD. Pt introduced to My Chart with verbalized understanding and plans to sign up soon. Discharged via wc to front entrance to meet awaiting vehicle to carry home. Accompanied by NT and parents.

## 2013-12-17 NOTE — Progress Notes (Signed)
Patient ID: Raymond [name], male   DOB: 09/16/1986, 27 y.o.   MRN: [id number] 1 Day Post-Op  Subjective: Feels well. Denies any pain, just incisional soreness. No nausea.  Objective: Vital signs in last 24 hours: Temp:  [98 F (36.7 C)-101 F (38.3 C)] 98.2 F (36.8 C) (12/20 0530) Pulse Rate:  [69-104] 75 (12/20 0530) Resp:  [16-24] 16 (12/20 0530) BP: (70-115)/(42-82) 91/53 mmHg (12/20 0530) SpO2:  [96 %-100 %] 98 % (12/20 0530) Weight:  [107 lb (48.535 kg)] 107 lb (48.535 kg) (12/19 1954)    Intake/Output from previous day: 12/19 0701 - 12/20 0700 In: 3361.7 [P.O.:550; I.V.:2711.7; IV Piggyback:100] Out: 1250 [Urine:1200; Emesis/NG output:50] Intake/Output this shift:    General appearance: alert, cooperative and no distress GI: normal findings: soft, non-tender Incision/Wound: clean and dry  Lab Results:   Recent Labs  12/16/13 0750  WBC 23.8*  HGB 15.8  HCT 44.0  PLT 250   BMET  Recent Labs  12/16/13 0750  NA 139  K 3.1*  CL 99  CO2 19  GLUCOSE 124*  BUN 12  CREATININE 0.90  CALCIUM 10.1     Studies/Results: Ct Abdomen Pelvis W Contrast  12/16/2013   CLINICAL DATA:  Abdominal pain  EXAM: CT ABDOMEN AND PELVIS WITH CONTRAST  TECHNIQUE: Multidetector CT imaging of the abdomen and pelvis was performed using the standard protocol following bolus administration of intravenous contrast.  CONTRAST:  50mL OMNIPAQUE IOHEXOL 300 MG/ML SOLN, OMNIPAQUE IOHEXOL 300 MG/ML SOLN  COMPARISON:  Plain film from earlier in the same day.  FINDINGS: The lung bases are free of acute infiltrate or sizable effusion. The liver, gallbladder, spleen, adrenal glands and pancreas are normal in their CT appearance. The kidneys are well visualized bilaterally without calculi or obstructive changes.  The appendix is dilated with both fluid and denser material within. No significant inflammatory changes are noted although this could represent very early appendicitis. Clinical  correlation is recommended the bladder is well distended. No pelvic mass lesion is seen. No free fluid is noted.  IMPRESSION: Dilated appendix without significant inflammatory changes this may represent some very early appendicitis. Clinical correlation is recommended.  No other focal abnormality is seen.   Electronically Signed   By: Alcide Clever M.D.   On: 12/16/2013 12:28   Dg Abd Acute W/chest  12/16/2013   CLINICAL DATA:  Abdominal cramping  EXAM: ACUTE ABDOMEN SERIES (ABDOMEN 2 VIEW & CHEST 1 VIEW)  COMPARISON:  Chest x-ray 02/04/2013  FINDINGS: There is no evidence of dilated bowel loops or free intraperitoneal air. There is radiodense material within the ascending colon likely reflecting ingested material. No radiopaque calculi or other significant radiographic abnormality is seen. Heart size and mediastinal contours are within normal limits. Both lungs are clear.  IMPRESSION: Negative abdominal radiographs.  No acute cardiopulmonary disease.   Electronically Signed   By: Elige Ko   On: 12/16/2013 09:15    Anti-infectives: Anti-infectives   Start     Dose/Rate Route Frequency Ordered Stop   12/16/13 2000  cefOXitin (MEFOXIN) 2 g in dextrose 5 % 50 mL IVPB     2 g 100 mL/hr over 30 Minutes Intravenous Every 6 hours 12/16/13 1949     12/16/13 1345  cefOXitin (MEFOXIN) 2 g in dextrose 5 % 50 mL IVPB     2 g 100 mL/hr over 30 Minutes Intravenous  Once 12/16/13 1338 12/16/13 1425      Assessment/Plan: s/p Procedure(s): APPENDECTOMY LAPAROSCOPIC Doing very  well. Feels well abdomen is entirely benign. Looks okay for discharge. Instructions given and followup in the office in 2 weeks.   LOS: 1 day    Tag Wurtz T 12/17/2013

## 2013-12-17 NOTE — Discharge Summary (Signed)
   Patient ID: VIDYUTH BELSITO [id number] 26 y.o. July 18, 1986  12/16/2013  Discharge date and time: 12/17/2013   Admitting Physician: Glenna Fellows T  Discharge Physician: Glenna Fellows T  Admission Diagnoses: Appendicitis [541]  Discharge Diagnoses: same  Operations: Procedure(s): APPENDECTOMY LAPAROSCOPIC  Admission Condition: good  Discharged Condition: good  Indication for Admission: patient is a 28 year old male who presented with acute lower abdominal pain and on workup was found to have evidence of acute appendicitis. He is admitted for emergency appendectomy.  Hospital Course: patient underwent emergency laparoscopic appendectomy on the day of admission. He had a severely inflamed appendix but no perforation or contamination. On the first postoperative day his pain is completely relieved. Abdomen is nontender and having just minimal incisional soreness. He is afebrile with normal vital signs. His already for discharge.   Disposition: Home  Patient Instructions:    Medication List         HYDROcodone-acetaminophen 5-325 MG per tablet  Commonly known as:  NORCO/VICODIN  Take 1-2 tablets by mouth every 4 (four) hours as needed for moderate pain.     LORazepam 1 MG tablet  Commonly known as:  ATIVAN  Take 1 mg by mouth daily as needed for anxiety.        Activity: activity as tolerated Diet: regular diet Wound Care: none needed  Follow-up:  With Dr. Ezzard Standing in 2 weeks.  Signed: Mariella Saa MD, FACS  12/17/2013, 9:37 AM

## 2013-12-19 ENCOUNTER — Encounter (HOSPITAL_COMMUNITY): Payer: Self-pay | Admitting: Surgery

## 2014-01-05 ENCOUNTER — Ambulatory Visit (INDEPENDENT_AMBULATORY_CARE_PROVIDER_SITE_OTHER): Payer: PRIVATE HEALTH INSURANCE | Admitting: Surgery

## 2014-01-05 ENCOUNTER — Encounter (INDEPENDENT_AMBULATORY_CARE_PROVIDER_SITE_OTHER): Payer: Self-pay | Admitting: Surgery

## 2014-01-05 VITALS — BP 110/61 | HR 66 | Temp 98.0°F | Resp 14 | Ht 66.0 in | Wt 104.2 lb

## 2014-01-05 DIAGNOSIS — K3580 Unspecified acute appendicitis: Secondary | ICD-10-CM

## 2014-01-05 NOTE — Progress Notes (Signed)
Postop visit  Mr. Raymond [name] had a lap appendectomy on 12/16/2013. He has done well without incident. He went back to work last week. His father is with him.  BP 110/61  Pulse 66  Temp(Src) 98 F (36.7 C) (Temporal)  Resp 14  Ht 5\' 6"  (1.676 m)  Wt 104 lb 3.2 oz (47.265 kg)  BMI 16.83 kg/m2  His wounds look good.  I made his return appointment on a when necessary basis.  Path copy to patient.  Ovidio Kinavid Lydiann Bonifas, MD, FACS Pager: [phone] [facility] Office: [phone] 01/05/2014

## 2014-05-08 ENCOUNTER — Telehealth: Payer: Self-pay | Admitting: Internal Medicine

## 2014-05-08 NOTE — Telephone Encounter (Signed)
[email or web address].Jennifer Castillo, CMA  

## 2014-05-08 NOTE — Telephone Encounter (Signed)
I don't know who is - it[place] very hard to find one in [place] but he should try [facility] and [facility] offices too

## 2014-05-08 NOTE — Telephone Encounter (Signed)
Dr Sherene SiresWert, do you have any specific rec on new PCP  Nobody downstairs taking new pt's  Please advise thanks

## 2014-05-09 NOTE — Telephone Encounter (Signed)
lmomtcb x 2  

## 2014-05-09 NOTE — Telephone Encounter (Signed)
Pt advised. Raymond [name], CMA  

## 2014-05-10 ENCOUNTER — Telehealth: Payer: Self-pay | Admitting: Internal Medicine

## 2014-05-10 ENCOUNTER — Encounter: Payer: Self-pay | Admitting: Internal Medicine

## 2014-05-10 ENCOUNTER — Ambulatory Visit (INDEPENDENT_AMBULATORY_CARE_PROVIDER_SITE_OTHER): Payer: BC Managed Care – PPO | Admitting: Internal Medicine

## 2014-05-10 ENCOUNTER — Ambulatory Visit (INDEPENDENT_AMBULATORY_CARE_PROVIDER_SITE_OTHER)
Admission: RE | Admit: 2014-05-10 | Discharge: 2014-05-10 | Disposition: A | Discharge status: Home / Self Care | Payer: BC Managed Care – PPO | Source: Ambulatory Visit | Attending: Internal Medicine | Admitting: Internal Medicine

## 2014-05-10 ENCOUNTER — Other Ambulatory Visit (INDEPENDENT_AMBULATORY_CARE_PROVIDER_SITE_OTHER): Payer: BC Managed Care – PPO

## 2014-05-10 VITALS — BP 152/96 | HR 109 | Temp 98.1°F | Ht 64.25 in | Wt 103.0 lb

## 2014-05-10 DIAGNOSIS — F411 Generalized anxiety disorder: Secondary | ICD-10-CM

## 2014-05-10 DIAGNOSIS — F419 Anxiety disorder, unspecified: Secondary | ICD-10-CM

## 2014-05-10 DIAGNOSIS — R634 Abnormal weight loss: Secondary | ICD-10-CM

## 2014-05-10 DIAGNOSIS — I10 Essential (primary) hypertension: Secondary | ICD-10-CM | POA: Insufficient documentation

## 2014-05-10 LAB — BASIC METABOLIC PANEL
BUN: 21 mg/dL (ref 6–23)
CALCIUM: 10.3 mg/dL (ref 8.4–10.5)
CO2: 26 mEq/L (ref 19–32)
CREATININE: 1 mg/dL (ref 0.4–1.5)
Chloride: 100 mEq/L (ref 96–112)
GFR: 91.82 mL/min (ref >60.00)
Glucose, Bld: 89 mg/dL (ref 70–99)
POTASSIUM: 4.2 meq/L (ref 3.5–5.1)
Sodium: 136 mEq/L (ref 135–145)

## 2014-05-10 LAB — CBC WITH DIFFERENTIAL/PLATELET
Basophils Absolute: 0.1 10*3/uL (ref 0.0–0.1)
Basophils Relative: 0.7 % (ref 0.0–3.0)
EOS PCT: 0.5 % (ref 0.0–5.0)
Eosinophils Absolute: 0 10*3/uL (ref 0.0–0.7)
HCT: 48.4 % (ref 39.0–52.0)
Hemoglobin: 16.7 g/dL (ref 13.0–17.0)
LYMPHS PCT: 28.5 % (ref 12.0–46.0)
Lymphs Abs: 2.6 10*3/uL (ref 0.7–4.0)
MCHC: 34.6 g/dL (ref 30.0–36.0)
MCV: 91.6 fl (ref 78.0–100.0)
MONOS PCT: 8.4 % (ref 3.0–12.0)
Monocytes Absolute: 0.8 10*3/uL (ref 0.1–1.0)
Neutro Abs: 5.7 10*3/uL (ref 1.4–7.7)
Neutrophils Relative %: 61.9 % (ref 43.0–77.0)
Platelets: 309 10*3/uL (ref 150.0–400.0)
RBC: 5.28 Mil/uL (ref 4.22–5.81)
RDW: 13 % (ref 11.5–15.5)
WBC: 9.2 10*3/uL (ref 4.0–10.5)

## 2014-05-10 LAB — TSH: TSH: 1.57 u[IU]/mL (ref 0.35–4.50)

## 2014-05-10 MED ORDER — CLONAZEPAM 0.5 MG PO TABS: ORAL_TABLET | ORAL | Status: DC

## 2014-05-10 MED ORDER — METOPROLOL TARTRATE 50 MG PO TABS: 50.0000 mg | ORAL_TABLET | Freq: Two times a day (BID) | ORAL | Status: DC

## 2014-05-10 NOTE — Assessment & Plan Note (Signed)
Tsh 1.57 > referred to psych

## 2014-05-10 NOTE — Progress Notes (Signed)
Subjective:    Patient ID: Raymond [name], male    DOB: October 12, 1986   MRN: [id number]    Brief patient profile:  October 07, 2010 Follow up. Pt c/o restless and being easily distracted since workloard started picking up. Has been on adderall in the past and believes he needs to be restarted on it. Previously rec'd from Pediatrician 20 mg per day with benefit. >>Rx Adderall, and refer to Psych   09/12/11 Acute OV  Complains of  chills, sore throat, cough w/ clear phlem, chest congestion, , feels exhausted, loss of apetite for 5 days . Developed upset stomach 2 days ago with n/v. Had body aches, malaise and chills. Joint aches and nausea. Called into office with phenergan suppository phoned in. Nausea and vomitting have stopped. He is eating today. Still has sore throat. Painful to swallow.  Was seen in office 2 days ago by Dr. Shelle Ironlance with URI symptoms w/ recommendations for symptomatic tx. NO chest pain, pleuritic pain , diarrhea, urinary symptoms , rash , recent travel or abx use.  >>supportive /symptom tx       02/04/13 Acute OV  Complains of sudden onset of tightness in chest , wheezing, increased SOB, lightheaded while at work. Was somewhat anxious. Felt that his pnuemothorax was back .  EKG without acute changes. Chest x-ray today with no sign of pneumothorax. Has heaviness along anterior chest wall. Pressure sensation . Some dyspnea . No cough or hemotpysis. No calf pain.  Drinks a beer nightly to "unwind" . No marajuana in 2 months. No smoking.   Patient reports that he has been under an enormous amount of stress recently having to move out of his apartment. He has had some roommate issues with financial strain. He says that he does worry a lot, and feels anxious. Has not been, and social with no interest in activities He denies any suicidal or homicidal ideations. Does state that he does feel down somewhat at times appear. rec Advil 200mg  2 tabs Twice daily  For 5 days  Warm heat to  chest wall I will call with labs  Begin Zoloft 50mg  daily .  Follow up with 4 weeks     05/10/2014 acute  ov/Raymond [name] re: mutisomatic co's Chief Complaint  Patient presents with  . Acute Visit    Pt c/o tingling in his hands for the past few days. He states that "I can feel my intestines"- BM's are inconsistant. He also c/o increased anxiety and depression.   new acute onset bilateral palm numbness sym = not present on dorsa of fingers, hands with tremulousness   Early satiety but no dysphagia/dyspepsia Symptoms much worse off ativan and "they can't see me til next week  No obvious day to day or daytime variabilty or assoc chronic cough or cp or chest tightness, subjective wheeze overt sinus or hb symptoms. No unusual exp hx or h/o childhood pna/ asthma or knowledge of premature birth.  Sleeping ok without nocturnal  or early am exacerbation  of respiratory  c/o's or need for noct saba. Also denies any obvious fluctuation of symptoms with weather or environmental changes or other aggravating or alleviating factors except as outlined above   Current Medications, Allergies, Complete Past Medical History, Past Surgical History, Family History, and Social History were reviewed in [facility] electronic medical record.  ROS  The following are not active complaints unless bolded sore throat, dysphagia, dental problems, itching, sneezing,  nasal congestion or excess/ purulent secretions, ear ache,  fever, chills, sweats, unintended wt loss, pleuritic or exertional cp, hemoptysis,  orthopnea pnd or leg swelling, presyncope, palpitations, heartburn, abdominal pain, anorexia, nausea, vomiting, diarrhea  or change in bowel or urinary habits, change in stools or urine, dysuria,hematuria,  rash, arthralgias, visual complaints, headache, numbness weakness or ataxia or problems with walking or coordination,  change in mood/affect or memory.            Past Medical History:  Recurrent L ptx since 2004   - CT Chest 06/22/2003 5% and no blebs  - Dec 2005 L 5 %  - June 05, 2010 5% > 100% resolved July 15, 2010  - 10/07/2012 recurrence x 10% >  Referred to [facility] Surgery ADD  - restart adderal October 07, 2010 and refer to psych                   Objective:   Physical Exam   GEN: A/Ox3; pleasant , NAD, thin very anxious white male   Wt Readings from Last 3 Encounters:  05/10/14 103 lb (46.72 kg)  01/05/14 104 lb 3.2 oz (47.265 kg)  12/16/13 107 lb (48.535 kg)        HEENT:  [place]/AT,  EACs-clear, TMs-wnl, NOSE-clear, THROAT-red no exudate or  lesions, no postnasal drip or exudate noted.   NECK:  Supple w/ fair ROM; no JVD; normal carotid impulses w/o bruits; no thyromegaly or nodules palpated; no lymphadenopathy.  RESP  Clear  P & A; w/o, wheezes/ rales/ or rhonchi.no accessory muscle use, no dullness to percussion    CARD:  RRR, no m/r/g  , no peripheral edema, pulses intact, no cyanosis or clubbing neg haman's crunch.  GI:   Soft & nt; nml bowel sounds; no organomegaly or masses detected.  Musco: Warm bil, no deformities or joint swelling noted.   Neuro: alert, no focal deficits noted.  No motor deficits or pathologic reflexes    Skin: Warm, no lesions or rashes, no significant bruising noted    CXR  05/10/2014 :  Pulmonary hyperinflation. Negative for pneumothorax. No acute abnormality.    Recent Labs Lab 05/10/14 1136  NA 136  K 4.2  CL 100  CO2 26  BUN 21  CREATININE 1.0  GLUCOSE 89    Recent Labs Lab 05/10/14 1136  HGB 16.7  HCT 48.4  WBC 9.2  PLT 309.0      Lab Results  Component Value Date   TSH 1.57 05/10/2014           Assessment & Plan:

## 2014-05-10 NOTE — Patient Instructions (Addendum)
Lopressor 50 mg twice daily   Clonazepam 0.5 mg one in am and two in pm as needed   Please remember to go to the lab and x-ray department downstairs for your tests - we will call you with the results when they are available.

## 2014-05-10 NOTE — Telephone Encounter (Signed)
Per MW blood type is not relevant here  As far as checking "insulin", the pt is concerned about his blood sugars, and the bmet and cbc will show if there is a problem here  I spoke with the pt and notified of recs per MW and he verbalized understanding

## 2014-05-10 NOTE — Progress Notes (Signed)
Quick Note:  Spoke with pt and notified of results per Dr. Wert. Pt verbalized understanding and denied any questions.  ______ 

## 2014-05-10 NOTE — Assessment & Plan Note (Signed)
I had an extended discussion with the patient today lasting 15 to 20 minutes of a 25 minute visit on the following issues:   Referred back to psych asap  In meantime prefer clonazepam over lorazepam in this setting > See instructions for specific recommendations which were reviewed directly with the patient who was given a copy with highlighter outlining the key components.

## 2014-05-10 NOTE — Telephone Encounter (Signed)
Spoke with pt. appt scheduled to come in and see MW this morning. Nothing further needed

## 2014-05-10 NOTE — Assessment & Plan Note (Signed)
-   started on lopressor 50 mg bid which will help the anxiety/tachycardia only

## 2014-06-07 ENCOUNTER — Telehealth: Payer: Self-pay | Admitting: Internal Medicine

## 2014-06-07 NOTE — Telephone Encounter (Signed)
Called and spoke with pt and he stated that his psychiatrist started him on oxcarbazepine 150 mg  1 at bedtime x 1 week then increase to 1 po BID after.   Pt stated that she also refilled his clonazepam.  Pt stated that since he has started taking this medication, his HR has dropped.  He is normally in the low 80's and he stated that now his HR has been in the low 50's.  Pt stated that this has him worried that he is going to go to sleep and not wake up.  Wanting to know recs from MW.  Pt also would like to know a PCP that MW recs for him.  MW please advise. Thanks  No Known Allergies   Current Outpatient Prescriptions on File Prior to Visit  Medication Sig Dispense Refill  . clonazePAM (KLONOPIN) 0.5 MG tablet One in am and two at bedtime as needed for nerves  45 tablet  0  . metoprolol (LOPRESSOR) 50 MG tablet Take 1 tablet (50 mg total) by mouth 2 (two) times daily.  60 tablet  11  . Probiotic CAPS Take 1 capsule by mouth daily.       No current facility-administered medications on file prior to visit.

## 2014-06-07 NOTE — Telephone Encounter (Signed)
HR ok [place] primary care doctor is fine

## 2014-06-07 NOTE — Telephone Encounter (Signed)
Called and spoke with pt and he is aware of MW recs.  Pt was given the number to  at [facility] and he will call and set up new PCP with them.  Nothing further is needed.

## 2014-07-17 ENCOUNTER — Emergency Department (HOSPITAL_COMMUNITY)
Admission: EM | Admit: 2014-07-17 | Discharge: 2014-07-17 | Disposition: A | Discharge status: Home / Self Care | Payer: BC Managed Care – PPO | Attending: Emergency Medicine | Admitting: Emergency Medicine

## 2014-07-17 ENCOUNTER — Encounter (HOSPITAL_COMMUNITY): Payer: Self-pay | Admitting: Emergency Medicine

## 2014-07-17 DIAGNOSIS — Z87891 Personal history of nicotine dependence: Secondary | ICD-10-CM | POA: Insufficient documentation

## 2014-07-17 DIAGNOSIS — Z8709 Personal history of other diseases of the respiratory system: Secondary | ICD-10-CM | POA: Insufficient documentation

## 2014-07-17 DIAGNOSIS — R45851 Suicidal ideations: Secondary | ICD-10-CM

## 2014-07-17 DIAGNOSIS — F39 Unspecified mood [affective] disorder: Secondary | ICD-10-CM | POA: Insufficient documentation

## 2014-07-17 DIAGNOSIS — F411 Generalized anxiety disorder: Secondary | ICD-10-CM | POA: Insufficient documentation

## 2014-07-17 HISTORY — DX: Anxiety disorder, unspecified: F41.9

## 2014-07-17 LAB — CARBOXYHEMOGLOBIN
CARBOXYHEMOGLOBIN: 1 % (ref 0.5–1.5)
Methemoglobin: 0.9 % (ref 0.0–1.5)
O2 Saturation: 50.7 %
TOTAL HEMOGLOBIN: 14.2 g/dL (ref 13.5–18.0)

## 2014-07-17 LAB — COMPREHENSIVE METABOLIC PANEL
ALK PHOS: 45 U/L (ref 39–117)
ALT: 11 U/L (ref 0–53)
AST: 21 U/L (ref 0–37)
Albumin: 4.9 g/dL (ref 3.5–5.2)
Anion gap: 12 (ref 5–15)
BUN: 20 mg/dL (ref 6–23)
CALCIUM: 9.2 mg/dL (ref 8.4–10.5)
CO2: 24 meq/L (ref 19–32)
Chloride: 106 mEq/L (ref 96–112)
Creatinine, Ser: 0.94 mg/dL (ref 0.50–1.35)
GFR calc non Af Amer: >90 mL/min (ref >90)
GLUCOSE: 85 mg/dL (ref 70–99)
POTASSIUM: 3.7 meq/L (ref 3.7–5.3)
Sodium: 142 mEq/L (ref 137–147)
TOTAL PROTEIN: 7.5 g/dL (ref 6.0–8.3)
Total Bilirubin: 0.3 mg/dL (ref 0.3–1.2)

## 2014-07-17 LAB — CBC
HCT: 39.9 % (ref 39.0–52.0)
HEMOGLOBIN: 13.7 g/dL (ref 13.0–17.0)
MCH: 31.1 pg (ref 26.0–34.0)
MCHC: 34.3 g/dL (ref 30.0–36.0)
MCV: 90.5 fL (ref 78.0–100.0)
Platelets: 213 10*3/uL (ref 150–400)
RBC: 4.41 MIL/uL (ref 4.22–5.81)
RDW: 12.9 % (ref 11.5–15.5)
WBC: 7.1 10*3/uL (ref 4.0–10.5)

## 2014-07-17 LAB — SALICYLATE LEVEL

## 2014-07-17 LAB — ACETAMINOPHEN LEVEL

## 2014-07-17 LAB — ETHANOL: Alcohol, Ethyl (B): <11 mg/dL (ref 0–11)

## 2014-07-17 MED ORDER — ONDANSETRON HCL 4 MG PO TABS: 4.0000 mg | ORAL_TABLET | Freq: Three times a day (TID) | ORAL | Status: DC | PRN

## 2014-07-17 MED ORDER — ALUM & MAG HYDROXIDE-SIMETH 200-200-20 MG/5ML PO SUSP: 30.0000 mL | ORAL | Status: DC | PRN

## 2014-07-17 MED ORDER — LORAZEPAM 1 MG PO TABS: 1.0000 mg | ORAL_TABLET | Freq: Three times a day (TID) | ORAL | Status: DC | PRN

## 2014-07-17 MED ORDER — IBUPROFEN 200 MG PO TABS: 600.0000 mg | ORAL_TABLET | Freq: Three times a day (TID) | ORAL | Status: DC | PRN

## 2014-07-17 MED ORDER — ACETAMINOPHEN 325 MG PO TABS: 650.0000 mg | ORAL_TABLET | ORAL | Status: DC | PRN

## 2014-07-17 NOTE — BH Assessment (Signed)
Consulted with Renne CriglerJoshua Geiple, PA-C to obtain clinicals prior to assessing patient.   Glorious PeachNajah Darrious Youman, MS, LCASA Assessment Counselor

## 2014-07-17 NOTE — Discharge Instructions (Signed)
Please followup with your primary care provider and she psychiatrists for continued evaluation and treatment.    Depression, Adult Depression is feeling sad, low, down in the dumps, blue, gloomy, or empty. In general, there are two kinds of depression:  Normal sadness or grief. This can happen after something upsetting. It often goes away on its own within 2 weeks. After losing a loved one (bereavement), normal sadness and grief may last longer than two weeks. It usually gets better with time.  Clinical depression. This kind lasts longer than normal sadness or grief. It keeps you from doing the things you normally do in life. It is often hard to function at home, work, or at school. It may affect your relationships with others. Treatment is often needed. GET HELP RIGHT AWAY IF:  You have thoughts about hurting yourself or others.  You lose touch with reality (psychotic symptoms). You may:  See or hear things that are not real.  Have untrue beliefs about your life or people around you.  Your medicine is giving you problems. MAKE SURE YOU:  Understand these instructions.  Will watch your condition.  Will get help right away if you are not doing well or get worse. Document Released: 01/17/2011 Document Revised: 09/08/2012 Document Reviewed: 04/15/2012 [facility] Patient Information 2015 [employer] [place]. This information is not intended to replace advice given to you by your health care provider. Make sure you discuss any questions you have with your health care provider.    No-harm Safety Contract  A no-harm safety contract is a written or verbal agreement between you and a mental health professional to promote safety. It contains specific actions and promises you agree to. The agreement also includes instructions from the therapist or doctor. The instructions will help prevent you from harming yourself or harming others. Harm can be as mild as pinching yourself, but can increase in  intensity to actions like burning or cutting yourself. The extreme level of self-harm would be committing suicide. No-harm safety contracts are also sometimes referred to as a Charity fundraiserno-suicide contract, suicide Financial controllerprevention contract, no-harm agreements or decisions, or a Engineer, manufacturing systemssafety contract.  REASONS FOR NO-HARM SAFETY CONTRACTS Safety contracts are just one part of an overall treatment plan to help keep you safe and free of harm. A safety contract may help to relieve anxiety, restore a sense of control, state clearly the alternatives to harm or suicide, and give you and your therapist or doctor a gauge for how you are doing in between visits. Many factors impact the decision to use a no-harm safety contract and its effectiveness. A proper overall treatment plan and evaluation and good patient understanding are the keys to good outcomes. CONTRACT ELEMENTS  A contract can range from simple to complex. They include all or some of the following:  Action statements. These are statements you agree to do or not do. Example: If I feel my life is becoming too difficult, I agree to do the following so there is no harm to myself or others:  Talk with family or friends.  Rid myself of all things that I could use to harm myself.  Do an activity I enjoy or have enjoyed in the recent past. Coping strategies. These are ways to think and feel that decrease stress, such as:  Use of affirmations or positive statements about self.  Good self-care, including improved grooming, and healthy eating, and healthy sleeping patterns.  Increase physical exercise.  Increase social involvement.  Focus on positive aspects of life.  Crisis management. This would include what to do if there was trouble following the contract or an urge to harm. This might include notifying family or your therapist of suicidal thoughts. Be open and honest about suicidal urges. To prevent a crisis, do the following:  List reasons to reach out for  support.  Keep contact numbers and available hours handy. Treatment goals. These are goals would include no suicidal thoughts, improved mood, and feelings of hopefulness. Listed responsibilities of different people involved in care. This could include family members. A family member may agree to remove firearms or other lethal weapons/substances from your ease of access. A timeline. A timeline can be in place from one therapy session to the next session. HOME CARE INSTRUCTIONS   Follow your no-harm safety contract.  Contact your therapist and/or doctor if you have any questions or concerns. MAKE SURE YOU:   Understand these instructions.  Will watch your condition. Noticing any mood changes or suicidal urges.  Will get help right away if you are not doing well or get worse. Document Released: 06/04/2010 Document Revised: 03/08/2012 Document Reviewed: 06/04/2010 [facility] Patient Information 2015 [place], [place]. This information is not intended to replace advice given to you by your health care provider. Make sure you discuss any questions you have with your health care provider.

## 2014-07-17 NOTE — ED Provider Notes (Signed)
CSN: [id number]     Arrival date & time 07/17/14  1553 History   First MD Initiated Contact with Patient 07/17/14 1643     Chief Complaint  Patient presents with  . Medical Clearance  . Suicide Attempt     (Consider location/radiation/quality/duration/timing/severity/associated sxs/prior Treatment) HPI Comments: Patient with history of anxiety -- presents after possible suicide attempt. Patient was involved in an emotional discussion with the family member today. Per police report, patient called the psychiatry office and made threats of harming himself. Psychiatry office called police who arrived to find the patient sitting in his vehicle in a garage with the engine running. Patient denies suicidal ideation to me. He admits to being upset but states he has too much going on in his life to commit suicide. Denies other medical complaints. Occasional alcohol, marijuana. The onset of this condition was acute. The course is constant. Aggravating factors: none. Alleviating factors: none.    The history is provided by the patient and medical records.    Past Medical History  Diagnosis Date  . ADD (attention deficit disorder)   . Pneumothorax   . Anxiety    Past Surgical History  Procedure Laterality Date  . Eye surgery      numerous eye surgeries  . Laparoscopic appendectomy N/A 12/16/2013    Procedure: APPENDECTOMY LAPAROSCOPIC;  Surgeon: Kandis Cocking, MD;  Location: [facility];  Service: General;  Laterality: N/A;  . Appendectomy     Family History  Problem Relation Age of Onset  . Asthma Sister    History  Substance Use Topics  . Smoking status: Former Smoker -- 1 years    Types: Cigarettes    Quit date: 09/01/2012  . Smokeless tobacco: Never Used     Comment: social smoker > "e-cig"  . Alcohol Use: Yes     Comment: 1 drink every 2 weeks    Review of Systems  Constitutional: Negative for fever.  HENT: Negative for rhinorrhea and sore throat.   Eyes: Negative for redness.   Respiratory: Negative for cough.   Cardiovascular: Negative for chest pain.  Gastrointestinal: Negative for nausea, vomiting, abdominal pain and diarrhea.  Genitourinary: Negative for dysuria.  Musculoskeletal: Negative for myalgias.  Skin: Negative for rash.  Neurological: Negative for headaches.  Psychiatric/Behavioral: Positive for dysphoric mood. Negative for suicidal ideas.      Allergies  Review of patient's allergies indicates no known allergies.  Home Medications   Prior to Admission medications   Medication Sig Start Date End Date Taking? Authorizing Provider  clonazePAM (KLONOPIN) 0.5 MG tablet One in am and two at bedtime as needed for nerves 05/10/14   Nyoka Cowden, MD  metoprolol (LOPRESSOR) 50 MG tablet Take 1 tablet (50 mg total) by mouth 2 (two) times daily. 05/10/14   Nyoka Cowden, MD  Probiotic CAPS Take 1 capsule by mouth daily.    Historical Provider, MD   BP 123/77  Pulse 90  Temp(Src) 98.8 F (37.1 C) (Oral)  Resp 18  SpO2 100% Physical Exam  Nursing note and vitals reviewed. Constitutional: He appears well-developed and well-nourished.  HENT:  Head: Normocephalic and atraumatic.  Eyes: Conjunctivae are normal. Right eye exhibits no discharge. Left eye exhibits no discharge.  Neck: Normal range of motion. Neck supple.  Cardiovascular: Normal rate, regular rhythm and normal heart sounds.   Pulmonary/Chest: Effort normal and breath sounds normal.  Abdominal: Soft. There is no tenderness.  Neurological: He is alert.  Skin: Skin is warm and  dry.  Psychiatric: His mood appears anxious. He expresses no homicidal and no suicidal ideation. He expresses no suicidal plans and no homicidal plans.    ED Course  Procedures (including critical care time) Labs Review Labs Reviewed  SALICYLATE LEVEL - Abnormal; Notable for the following:    Salicylate Lvl <2.0 (*)    All other components within normal limits  ACETAMINOPHEN LEVEL  CBC  COMPREHENSIVE  METABOLIC PANEL  ETHANOL  CARBOXYHEMOGLOBIN  URINE RAPID DRUG SCREEN (HOSP PERFORMED)    Imaging Review No results found.   EKG Interpretation None      5:18 PM Patient seen and examined. Work-up initiated. Medications ordered. TTS consult pending. Patient is currently voluntary. He will need evaluation and will need IVC orders if he tries to leave.   Vital signs reviewed and are as follows: Filed Vitals:   07/17/14 1620  BP: 123/77  Pulse: 90  Temp: 98.8 F (37.1 C)  Resp: 18   7:03 PM Discussed case with TTS who will evaluate now.   Patient is medically cleared.   8:26 PM TTS has seen. Patient is going to contract for safety and we will discharge. He has friend who will stay with him and he has psych follow-up.    MDM   Final diagnoses:  Suicidal ideation   Plan as above. Seen by psych, cleared to go home, plan in place.    Renne CriglerJoshua Elishua Radford, PA-C 07/17/14 2028

## 2014-07-17 NOTE — ED Provider Notes (Signed)
Medical screening examination/treatment/procedure(s) were performed by non-physician practitioner and as supervising physician I was immediately available for consultation/collaboration.    Nelia Shiobert L Lawan Nanez, MD 07/17/14 [phone]

## 2014-07-17 NOTE — ED Notes (Signed)
Pt changed in to paper scrubs and wanded by security. 

## 2014-07-17 NOTE — BH Assessment (Signed)
Tele Assessment Note   Raymond [name] is an 28 y.o. male.  -Clinician talked with Raymond Bleacher, PA regarding patient.  Patient had been brought in by [name].  Pt's psychiatry office had called them after patient had called them.  Patient had been very upset on the phone and said that he was in his garage listening to the radio.  According to the [name], the psychiatry office said patient had made suicidal statements.  They worried that patient may have been trying poison himself with carbon monoxide so they called 911 to patient's address and he was brought in.  Pt however has denied any SI/HI w/ Raymond [name].  Patient did say that he had called [facility] where his psychiatrist, Dr. Lyndal [name] practices.  Patient had had a heated argument with [name] today and had called [facility].  He said that he does not remember making a suicidal statement to them but they did ask what he was doing and he told them he was in his garage listening to the radio.  Patient said that he was tearful with them on the phone.  He believes that this was a case of miscommunication.  He did not have the engine running he reports and he had no intention to kill himself.  Patient currently denies any plan or intention to kill himself and denies any past attempt to do the same.    Patient said that he has a short temper with his grandmother.  His grandfather died suddenly of a heart attack this past November.  Patient said that grandmother fixates on the events of that day and patient always gets very upset when talking to her so he limits contact with her.  Patient denies any HI or A/V hallucinations.  Patient has been seeing Dr. Lyndal [name] at [facility] for the last four years for anxiety.  He has had some medication changes over the years but reports that the current regimen is working.  Last appointment was 07/03/14 and next appointment is in first part of September.  Patient is willing to get seen by her  earlier though.  Patient had a counselor in the past but found out earlier this month that this counselor has moved her practice to [place].  He knows he needs to get in with a new counselor.  Patient is willing to contract for safety and says that a friend named Raymond [name] has said that she will come and stay with him tonight and for the next few nights.  -Pt care discussed with Raymond Sievert, PA who said that if patient can contract for safety and follow up with current provider he was fine with patient being discharged.  Patient discussed with Raymond Bleacher, PA who is in agreement with patient signing a no-harm contract.  This clinician called Raymond [name], the nurse and faxed him the no harm contract and a list of psychiatrist/counselors to follow up with.  Axis I: Anxiety Disorder NOS Axis II: Deferred Axis III:  Past Medical History  Diagnosis Date  . ADD (attention deficit disorder)   . Pneumothorax   . Anxiety    Axis IV: problems related to social environment Axis V: 51-60 moderate symptoms  Past Medical History:  Past Medical History  Diagnosis Date  . ADD (attention deficit disorder)   . Pneumothorax   . Anxiety     Past Surgical History  Procedure Laterality Date  . Eye surgery      numerous eye surgeries  . Laparoscopic  appendectomy N/A 12/16/2013    Procedure: APPENDECTOMY LAPAROSCOPIC;  Surgeon: [name], MD;  Location: [facility];  Service: General;  Laterality: N/A;  . Appendectomy      Family History:  Family History  Problem Relation Age of Onset  . Asthma Sister     Social History:  reports that he quit smoking about 22 months ago. His smoking use included Cigarettes. He smoked 0.00 packs per day for 1 year. He has never used smokeless tobacco. He reports that he drinks alcohol. He reports that he uses illicit drugs (Marijuana).  Additional Social History:  Alcohol / Drug Use Pain Medications: See PTA medication list Prescriptions: See PTA medication list Over the  Counter: See PTA medication lsit History of alcohol / drug use?: No history of alcohol / drug abuse  CIWA: CIWA-Ar BP: 125/83 mmHg Pulse Rate: 85 COWS:    Allergies: No Known Allergies  Home Medications:  (Not in a hospital admission)  OB/GYN Status:  No LMP for male patient.  General Assessment Data [facility] Is this a Tele or Face-to-Face Assessment?: Tele Assessment Is this an Initial Assessment or a Re-assessment for this encounter?: Initial Assessment Living Arrangements: Alone Can pt return to current living arrangement?: Yes Admission Status: Voluntary Is patient capable of signing voluntary admission?: Yes Transfer from: Acute Hospital Referral Source: Psychiatrist ([facility])     [facility] Living Arrangements: Alone Name of Psychiatrist: Dr. Misty StanleyLisa [name] at [facility] Name of Therapist: None at this time     Risk to self Suicidal Ideation: No Suicidal Intent: No Is patient at risk for suicide?: No Suicidal Plan?: No Access to Means: No What has been your use of drugs/alcohol within the last 12 months?: Pt denies use Previous Attempts/Gestures: No How many times?: 0 Other Self Harm Risks: None Triggers for Past Attempts: None known Intentional Self Injurious Behavior: None Family Suicide History: No Recent stressful life event(s): Conflict (Comment) (Arguement with [name].) Persecutory voices/beliefs?: No Depression: No Depression Symptoms:  (Pt reports more anxiety than depressive symptoms.) Substance abuse history and/or treatment for substance abuse?: No Suicide prevention information given to non-admitted patients: Not applicable  Risk to Others Homicidal Ideation: No Thoughts of Harm to Others: No Current Homicidal Intent: No Current Homicidal Plan: No Access to Homicidal Means: No Identified Victim: No one History of harm to others?: No Assessment of Violence: None Noted Violent  Behavior Description: Pt denies.  Pt calm & cooperative. Does patient have access to weapons?: No Criminal Charges Pending?: No Does patient have a court date: No  Psychosis Hallucinations: None noted Delusions: None noted  Mental Status Report Appear/Hygiene: Unremarkable;In scrubs Eye Contact: Good Motor Activity: Freedom of movement;Unremarkable Speech: Logical/coherent Level of Consciousness: Alert Mood: Anxious;Pleasant Affect: Anxious Anxiety Level: Moderate Thought Processes: Coherent;Relevant Judgement: Unimpaired Orientation: Person;Place;Time;Situation Obsessive Compulsive Thoughts/Behaviors: Minimal  Cognitive Functioning Concentration: Decreased Memory: Recent Intact;Remote Intact IQ: Average Insight: Good Impulse Control: Good Appetite: Fair Weight Loss: 0 Weight Gain: 0 Sleep: No Change Total Hours of Sleep: 8 Vegetative Symptoms: None  ADLScreening [facility]) Patient's cognitive ability adequate to safely complete daily activities?: Yes Patient able to express need for assistance with ADLs?: Yes Independently performs ADLs?: Yes (appropriate for developmental age)  Prior Inpatient Therapy Prior Inpatient Therapy: No Prior Therapy Dates: N/A Prior Therapy Facilty/Provider(s): N/A Reason for Treatment: N/A  Prior Outpatient Therapy Prior Outpatient Therapy: Yes Prior Therapy Dates: Last 4 years Prior Therapy Facilty/Provider(s): Dr. Lyndal PulleyPolus  at [facility] Reason for Treatment: med monitoring  ADL Screening (condition at time of admission) Patient's cognitive ability adequate to safely complete daily activities?: Yes Is the patient deaf or have difficulty hearing?: No Does the patient have difficulty seeing, even when wearing glasses/contacts?: No Does the patient have difficulty concentrating, remembering, or making decisions?: No Patient able to express need for assistance with ADLs?: Yes Does the patient have  difficulty dressing or bathing?: No Independently performs ADLs?: Yes (appropriate for developmental age) Does the patient have difficulty walking or climbing stairs?: No Weakness of Legs: None Weakness of Arms/Hands: None  Home Assistive Devices/Equipment Home Assistive Devices/Equipment: None    Abuse/Neglect Assessment (Assessment to be complete while patient is alone) Physical Abuse: Denies Verbal Abuse: Denies Sexual Abuse: Denies Exploitation of patient/patient's resources: Denies Self-Neglect: Denies Values / Beliefs Cultural Requests During Hospitalization: None Spiritual Requests During Hospitalization: None   Advance Directives (For Healthcare) Advance Directive: Patient does not have advance directive;Patient would not like information Pre-existing out of facility DNR order (yellow form or pink MOST form): No    Additional Information 1:1 In Past 12 Months?: No CIRT Risk: No Elopement Risk: No Does patient have medical clearance?: Yes     Disposition:  Disposition Initial Assessment Completed for this Encounter: Yes Disposition of Patient: Other dispositions Other disposition(s): To current provider (Per Raymond Sievert, PA pt to f/u w/ current provider upon d/c)  [place] Lodge 07/17/2014 9:29 PM

## 2014-07-17 NOTE — ED Notes (Signed)
Pt sts had fight with grandmother today and got upset; pt denies SI/HI; pt sts grandfather died several months ago; per [name] who is with pt he called his psychiatry office voicing threats of SI while sitting in his running car in a garage; per EMS and [employer] carbon monoxide levels in area and of pt were not dangerous; pt cooperative at present; pt sts he needs some med adjustment

## 2014-07-18 ENCOUNTER — Telehealth: Payer: Self-pay | Admitting: Internal Medicine

## 2014-07-18 NOTE — Telephone Encounter (Signed)
Ok to see but not as add on See if Raymond [name] has avail or my next avail is ok Needs to bring all meds with him

## 2014-07-18 NOTE — Telephone Encounter (Signed)
Called spoke with pt. He is scheduled to come in and see MW on Monday. Pt aware to bring all meds. Nothing further needed

## 2014-07-18 NOTE — Telephone Encounter (Signed)
Called spoke with pt. He reports he has lost 10 lbs in 2 weeks.  He is not sure where this is coming from.  He has been trying to find a new PCP but no one can see him prior to October. He is scared to wait to be seen this long. Please advise MW thanks

## 2014-07-21 ENCOUNTER — Telehealth: Payer: Self-pay | Admitting: Internal Medicine

## 2014-07-21 NOTE — Telephone Encounter (Signed)
Raymond [name] had letter as of yesterday. Please advise MW thanks

## 2014-07-24 ENCOUNTER — Encounter: Payer: Self-pay | Admitting: Internal Medicine

## 2014-07-24 ENCOUNTER — Ambulatory Visit (INDEPENDENT_AMBULATORY_CARE_PROVIDER_SITE_OTHER): Payer: BC Managed Care – PPO | Admitting: Internal Medicine

## 2014-07-24 VITALS — BP 110/70 | HR 90 | Temp 98.2°F | Ht 64.0 in | Wt 99.4 lb

## 2014-07-24 DIAGNOSIS — I10 Essential (primary) hypertension: Secondary | ICD-10-CM

## 2014-07-24 DIAGNOSIS — R634 Abnormal weight loss: Secondary | ICD-10-CM

## 2014-07-24 NOTE — Telephone Encounter (Signed)
Reviewed and discussed with pt> has pysch doctor establish and rec pt establish with [facility] in [place] where [place]

## 2014-07-24 NOTE — Assessment & Plan Note (Signed)
Adequate control on present rx, reviewed > no change in rx needed  (no rx for now)

## 2014-07-24 NOTE — Assessment & Plan Note (Signed)
Nl tsh  05/10/14    Suspect this is all related to psych issues as is the fatigue and insomnia > defer to psych.   He will also need a new primary care provider and so [facility] [place]

## 2014-07-24 NOTE — Progress Notes (Signed)
Subjective:    Patient ID: Raymond [name], male    DOB: [birth year]   MRN: [id number]    Brief patient profile:  October 07, 2010 Follow up. Pt c/o restless and being easily distracted since workloard started picking up. Has been on adderall in the past and believes he needs to be restarted on it. Previously rec'd from Pediatrician 20 mg per day with benefit. >>Rx Adderall, and refer to Psych   09/12/11 Acute OV  Complains of  chills, sore throat, cough w/ clear phlem, chest congestion, , feels exhausted, loss of apetite for 5 days . Developed upset stomach 2 days ago with n/v. Had body aches, malaise and chills. Joint aches and nausea. Called into office with phenergan suppository phoned in. Nausea and vomitting have stopped. He is eating today. Still has sore throat. Painful to swallow.  Was seen in office 2 days ago by Dr. Shelle Ironlance with URI symptoms w/ recommendations for symptomatic tx. NO chest pain, pleuritic pain , diarrhea, urinary symptoms , rash , recent travel or abx use.  >>supportive /symptom tx       02/04/13 Acute OV  Complains of sudden onset of tightness in chest , wheezing, increased SOB, lightheaded while at work. Was somewhat anxious. Felt that his pnuemothorax was back .  EKG without acute changes. Chest x-ray today with no sign of pneumothorax. Has heaviness along anterior chest wall. Pressure sensation . Some dyspnea . No cough or hemotpysis. No calf pain.  Drinks a beer nightly to "unwind" . No marajuana in 2 months. No smoking.   Patient reports that he has been under an enormous amount of stress recently having to move out of his apartment. He has had some roommate issues with financial strain. He says that he does worry a lot, and feels anxious. Has not been, and social with no interest in activities He denies any suicidal or homicidal ideations. Does state that he does feel down somewhat at times appear. rec Advil 200mg  2 tabs Twice daily  For 5 days  Warm heat to  chest wall I will call with labs  Begin Zoloft 50mg  daily .  Follow up with 4 weeks     05/10/2014 acute  ov/Wert re: mutisomatic co's Chief Complaint  Patient presents with  . Acute Visit    Pt c/o tingling in his hands for the past few days. He states that "I can feel my intestines"- BM's are inconsistant. He also c/o increased anxiety and depression.   new acute onset bilateral palm numbness sym = not present on dorsa of fingers, hands with tremulousness   Early satiety but no dysphagia/dyspepsia Symptoms much worse off ativan and "they can't see me til next week rec No change rx   07/17/14 brought in by police for ? Suicide attempt, pt denied it and was released back to outpt pysch f/u      07/24/2014 post hosp ov/Wert re: hbp/ fatigue/ insomnia/ poor appetite with wt loss   Chief Complaint  Patient presents with  . Acute Visit    Pt c/o feeling fatigued for the past month. He has also noticed loss of appetite.  He also c/o "lump" in his stomach- non painful.    really no abd pain or ever tenderness over the "lump" which is nothing more than his lower rib cage which is more noticeable now that he's losing wt.   No obvious day to day or daytime variabilty or assoc chronic cough or cp or chest tightness, subjective  wheeze overt sinus or hb symptoms. No unusual exp hx or h/o childhood pna/ asthma or knowledge of premature birth.  Sleeping ok without nocturnal  or early am exacerbation  of respiratory  c/o's or need for noct saba. Also denies any obvious fluctuation of symptoms with weather or environmental changes or other aggravating or alleviating factors except as outlined above   Current Medications, Allergies, Complete Past Medical History, Past Surgical History, Family History, and Social History were reviewed in [employer] record.  ROS  The following are not active complaints unless bolded sore throat, dysphagia, dental problems, itching, sneezing,   nasal congestion or excess/ purulent secretions, ear ache,   fever, chills, sweats, unintended wt loss, pleuritic or exertional cp, hemoptysis,  orthopnea pnd or leg swelling, presyncope, palpitations, heartburn, abdominal pain, anorexia, nausea, vomiting, diarrhea  or change in bowel or urinary habits, change in stools or urine, dysuria,hematuria,  rash, arthralgias, visual complaints, headache, numbness weakness or ataxia or problems with walking or coordination,  change in mood/affect or memory.            Past Medical History:  Recurrent L ptx since 2004  - CT Chest 06/22/2003 5% and no blebs  - Dec 2005 L 5 %  - June 05, 2010 5% > 100% resolved July 15, 2010  - 10/07/2012 recurrence x 10% >  Referred to T Surgery ADD  - restart adderal October 07, 2010 and refer to psych                   Objective:   Physical Exam   GEN: A/Ox3;  NAD, thin  anxious white male does not appear depressed   07/24/2014        99 lb  Wt Readings from Last 3 Encounters:  05/10/14 103 lb (46.72 kg)  01/05/14 104 lb 3.2 oz (47.265 kg)  12/16/13 107 lb (48.535 kg)        HEENT:  [place]/AT,  EACs-clear, TMs-wnl, NOSE-clear, THROAT-red no exudate or  lesions, no postnasal drip or exudate noted.   NECK:  Supple w/ fair ROM; no JVD; normal carotid impulses w/o bruits; no thyromegaly or nodules palpated; no lymphadenopathy.  RESP  Clear  P & A; w/o, wheezes/ rales/ or rhonchi.no accessory muscle use, no dullness to percussion    CARD:  RRR, no m/r/g  , no peripheral edema, pulses intact, no cyanosis or clubbing neg haman's crunch.  GI:   Soft & nt; nml bowel sounds; no organomegaly or masses detected. The abd wall is scaphoid and the "lump" is the protuberance of his lower libs, no tenderness or abd mass noted.   Musco: Warm bil, no deformities or joint swelling noted.   Neuro: alert, no focal deficits noted.  No motor deficits or pathologic reflexes    Skin: Warm, no lesions or rashes, no  significant bruising noted    CXR  05/10/2014 : Pulmonary hyperinflation. Negative for pneumothorax. No acute Abnormality   Recent Labs Lab 07/17/14 1655  NA 142  K 3.7  CL 106  CO2 24  BUN 20  CREATININE 0.94  GLUCOSE 85    Recent Labs Lab 07/17/14 1655  HGB 13.7  HCT 39.9  WBC 7.1  PLT 213     Lab Results  Component Value Date   TSH 1.57 05/10/2014           Assessment & Plan:

## 2014-07-24 NOTE — Patient Instructions (Signed)
There is no indication of a metabolic or medical explanation for your symptoms.  You need to [facility]

## 2014-11-15 ENCOUNTER — Telehealth: Payer: Self-pay | Admitting: Internal Medicine

## 2014-11-15 NOTE — Telephone Encounter (Signed)
Pt scheduled to see TP 11/16/14 at 3pm Pt states that he will see her tomorrow but will call if symptoms worsen before end of day. Nothing further needed.

## 2014-11-15 NOTE — Telephone Encounter (Signed)
Pt states that he is having a hard time finding a new PCP to establish with.  BP and HR low the past couple days and body feels stiff and achy and chest tightness. Pt states that he is having irritability/anxiety/aggression upon awakening some mornings. Not every morning but pt notes this is happening multiple times during the week. Denies cough, wheezing and fever-- pt states that he has not been checking temp.  Pt requesting OV if available to be evaluated. Also open for referral to someone that might be able to help. Please advise Dr Sherene SiresWert. Thanks.

## 2014-11-16 ENCOUNTER — Ambulatory Visit: Payer: BC Managed Care – PPO | Admitting: Adult Health

## 2014-11-20 ENCOUNTER — Other Ambulatory Visit (INDEPENDENT_AMBULATORY_CARE_PROVIDER_SITE_OTHER): Payer: BC Managed Care – PPO

## 2014-11-20 ENCOUNTER — Encounter: Payer: Self-pay | Admitting: Adult Health

## 2014-11-20 ENCOUNTER — Encounter (INDEPENDENT_AMBULATORY_CARE_PROVIDER_SITE_OTHER): Payer: Self-pay

## 2014-11-20 ENCOUNTER — Ambulatory Visit (INDEPENDENT_AMBULATORY_CARE_PROVIDER_SITE_OTHER): Payer: BC Managed Care – PPO | Admitting: Adult Health

## 2014-11-20 VITALS — BP 94/56 | HR 102 | Temp 97.8°F | Ht 65.0 in | Wt 108.4 lb

## 2014-11-20 DIAGNOSIS — F419 Anxiety disorder, unspecified: Secondary | ICD-10-CM

## 2014-11-20 DIAGNOSIS — R5383 Other fatigue: Secondary | ICD-10-CM

## 2014-11-20 DIAGNOSIS — M2550 Pain in unspecified joint: Secondary | ICD-10-CM

## 2014-11-20 DIAGNOSIS — R531 Weakness: Secondary | ICD-10-CM

## 2014-11-20 LAB — CBC WITH DIFFERENTIAL/PLATELET
Basophils Absolute: 0 10*3/uL (ref 0.0–0.1)
Basophils Relative: 0.3 % (ref 0.0–3.0)
Eosinophils Absolute: 0 10*3/uL (ref 0.0–0.7)
Eosinophils Relative: 0.5 % (ref 0.0–5.0)
HCT: 42.8 % (ref 39.0–52.0)
HEMOGLOBIN: 14.4 g/dL (ref 13.0–17.0)
LYMPHS PCT: 28.3 % (ref 12.0–46.0)
Lymphs Abs: 2.5 10*3/uL (ref 0.7–4.0)
MCHC: 33.7 g/dL (ref 30.0–36.0)
MCV: 92.5 fl (ref 78.0–100.0)
MONOS PCT: 5.5 % (ref 3.0–12.0)
Monocytes Absolute: 0.5 10*3/uL (ref 0.1–1.0)
NEUTROS ABS: 5.7 10*3/uL (ref 1.4–7.7)
Neutrophils Relative %: 65.4 % (ref 43.0–77.0)
Platelets: 239 10*3/uL (ref 150.0–400.0)
RBC: 4.63 Mil/uL (ref 4.22–5.81)
RDW: 13.2 % (ref 11.5–15.5)
WBC: 8.8 10*3/uL (ref 4.0–10.5)

## 2014-11-20 NOTE — Patient Instructions (Signed)
Increase fluid intake Advil 200 mg 2 tablets twice daily for 5 days, take with food Labs today Follow up Dr. Sherene SiresWert in 2 months and as needed Please contact office for sooner follow up if symptoms do not improve or worsen or seek emergency care

## 2014-11-20 NOTE — Progress Notes (Signed)
Subjective:    Patient ID: Raymond [name], male    DOB: 1986/10/13   MRN: [id number]    Brief patient profile:  October 07, 2010 Follow up. Pt c/o restless and being easily distracted since workloard started picking up. Has been on adderall in the past and believes he needs to be restarted on it. Previously rec'd from Pediatrician 20 mg per day with benefit. >>Rx Adderall, and refer to Psych   09/12/11 Acute OV  Complains of  chills, sore throat, cough w/ clear phlem, chest congestion, , feels exhausted, loss of apetite for 5 days . Developed upset stomach 2 days ago with n/v. Had body aches, malaise and chills. Joint aches and nausea. Called into office with phenergan suppository phoned in. Nausea and vomitting have stopped. He is eating today. Still has sore throat. Painful to swallow.  Was seen in office 2 days ago by Dr. Shelle Ironlance with URI symptoms w/ recommendations for symptomatic tx. NO chest pain, pleuritic pain , diarrhea, urinary symptoms , rash , recent travel or abx use.  >>supportive /symptom tx       02/04/13 Acute OV  Complains of sudden onset of tightness in chest , wheezing, increased SOB, lightheaded while at work. Was somewhat anxious. Felt that his pnuemothorax was back .  EKG without acute changes. Chest x-ray today with no sign of pneumothorax. Has heaviness along anterior chest wall. Pressure sensation . Some dyspnea . No cough or hemotpysis. No calf pain.  Drinks a beer nightly to "unwind" . No marajuana in 2 months. No smoking.   Patient reports that he has been under an enormous amount of stress recently having to move out of his apartment. He has had some roommate issues with financial strain. He says that he does worry a lot, and feels anxious. Has not been, and social with no interest in activities He denies any suicidal or homicidal ideations. Does state that he does feel down somewhat at times appear. rec Advil 200mg  2 tabs Twice daily  For 5 days  Warm heat to  chest wall I will call with labs  Begin Zoloft 50mg  daily .  Follow up with 4 weeks     05/10/2014 acute  ov/[facility] re: mutisomatic co's Chief Complaint  Patient presents with  . Acute Visit    Pt c/o tingling in his hands for the past few days. He states that "I can feel my intestines"- BM's are inconsistant. He also c/o increased anxiety and depression.   new acute onset bilateral palm numbness sym = not present on dorsa of fingers, hands with tremulousness   Early satiety but no dysphagia/dyspepsia Symptoms much worse off ativan and "they can't see me til next week rec No change rx   07/17/14 brought in by police for ? Suicide attempt, pt denied it and was released back to outpt pysch f/u      07/24/2014 post hosp ov/[facility] re: hbp/ fatigue/ insomnia/ poor appetite with wt loss   Chief Complaint  Patient presents with  . Acute Visit    Pt c/o feeling fatigued for the past month. He has also noticed loss of appetite.  He also c/o "lump" in his stomach- non painful.    really no abd pain or ever tenderness over the "lump" which is nothing more than his lower rib cage which is more noticeable now that he's losing wt.   11/20/2014 Acute OV  Complains that he has not been feeling well for last month.  Complains of  increased fatigue, "pain in bones", c/o waking frazzled and panicked x3-4weeks. Eats okay but does not drink enough at times.  Does some extra stress w/ work "but nothing unmanagable" We discussed his depression/anxiety. Was admitted earlier this year for suicide attempt. Says this was a misunderstanding and he did not mean this.  He denies depression or suicidal ideations.  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  He is seeing a counselor/psychiatrist.  He denies any chest pain, orthopnea, PND, leg swelling, rash, joint swelling, fever, diarrhea, nausea, vomiting.   Current Medications, Allergies, Complete Past Medical History, Past Surgical History, Family  History, and Social History were reviewed in [facility] electronic medical record.  ROS  The following are not active complaints unless bolded sore throat, dysphagia, dental problems, itching, sneezing,  nasal congestion or excess/ purulent secretions, ear ache,   fever, chills, sweats, unintended wt loss, pleuritic or exertional cp, hemoptysis,  orthopnea pnd or leg swelling, presyncope, palpitations, heartburn, abdominal pain, anorexia, nausea, vomiting, diarrhea  or change in bowel or urinary habits, change in stools or urine, dysuria,hematuria,  rash, arthralgias, visual complaints, headache,  weakness or ataxia or problems with walking or coordination,              Past Medical History:  Recurrent L ptx since 2004  - CT Chest 06/22/2003 5% and no blebs  - Dec 2005 L 5 %  - June 05, 2010 5% > 100% resolved July 15, 2010  - 10/07/2012 recurrence x 10% >  Referred to T Surgery ADD  - restart adderal October 07, 2010 and refer to psych      Objective:   Physical Exam   GEN: A/Ox3;  NAD, thin  anxious white male    07/24/2014        99 lb >108 11/20/2014    HEENT:  [place]/AT,  EACs-clear, TMs-wnl, NOSE-clear, THROAT-red no exudate or  lesions, no postnasal drip or exudate noted.   NECK:  Supple w/ fair ROM; no JVD; normal carotid impulses w/o bruits; no thyromegaly or nodules palpated; no lymphadenopathy.  RESP  Clear  P & A; w/o, wheezes/ rales/ or rhonchi.no accessory muscle use, no dullness to percussion    CARD:  RRR, no m/r/g  , no peripheral edema, pulses intact, no cyanosis or clubbing neg haman's crunch.  GI:   Soft & nt; nml bowel sounds; no organomegaly or masses detected. The abd wall is scaphoid and the "lump" is the protuberance of his lower libs, no tenderness or abd mass noted.   Musco: Warm bil, no deformities or joint swelling noted.   Neuro: alert, no focal deficits noted.  No motor deficits or pathologic reflexes    Skin: Warm, no lesions or rashes, no  significant bruising noted    CXR  05/10/2014 : Pulmonary hyperinflation. Negative for pneumothorax. No acute Abnormality   Recent Labs Lab 07/17/14 1655  NA 142  K 3.7  CL 106  CO2 24  BUN 20  CREATININE 0.94  GLUCOSE 85    Recent Labs Lab 07/17/14 1655  HGB 13.7  HCT 39.9  WBC 7.1  PLT 213     Lab Results  Component Value Date   TSH 1.57 05/10/2014           Assessment & Plan:

## 2014-11-21 LAB — BASIC METABOLIC PANEL
BUN: 12 mg/dL (ref 6–23)
CHLORIDE: 101 meq/L (ref 96–112)
CO2: 29 meq/L (ref 19–32)
Calcium: 9.3 mg/dL (ref 8.4–10.5)
Creatinine, Ser: 1.2 mg/dL (ref 0.4–1.5)
GFR: 75.23 mL/min (ref >60.00)
Glucose, Bld: 86 mg/dL (ref 70–99)
POTASSIUM: 4.5 meq/L (ref 3.5–5.1)
Sodium: 139 mEq/L (ref 135–145)

## 2014-11-21 LAB — HEPATIC FUNCTION PANEL
ALK PHOS: 37 U/L — AB (ref 39–117)
ALT: 13 U/L (ref 0–53)
AST: 24 U/L (ref 0–37)
Albumin: 4.9 g/dL (ref 3.5–5.2)
Bilirubin, Direct: 0.1 mg/dL (ref 0.0–0.3)
TOTAL PROTEIN: 7.2 g/dL (ref 6.0–8.3)
Total Bilirubin: 0.6 mg/dL (ref 0.2–1.2)

## 2014-11-21 LAB — TSH: TSH: 2.86 u[IU]/mL (ref 0.35–4.50)

## 2014-11-21 LAB — RHEUMATOID FACTOR: Rhuematoid fact SerPl-aCnc: <10 IU/mL (ref <=14)

## 2014-11-21 LAB — ANA: Anti Nuclear Antibody(ANA): NEGATIVE

## 2014-11-22 DIAGNOSIS — M2550 Pain in unspecified joint: Secondary | ICD-10-CM | POA: Insufficient documentation

## 2014-11-22 NOTE — Assessment & Plan Note (Signed)
Vague symptoms with arthralgias ? Etiology  Will check labs with ESR , ANA and RA factor   Plan  Increase fluid intake Advil 200 mg 2 tablets twice daily for 5 days, take with food Labs today Follow up Dr. Melvyn Novas in 2 months and as needed Please contact office for sooner follow up if symptoms do not improve or worsen or seek emergency care

## 2014-11-22 NOTE — Progress Notes (Signed)
EMR records  and ov reviewed, agree with a/p as outlined

## 2015-01-22 ENCOUNTER — Ambulatory Visit: Payer: BC Managed Care – PPO | Admitting: Internal Medicine

## 2015-01-24 ENCOUNTER — Telehealth: Payer: Self-pay | Admitting: Internal Medicine

## 2015-01-24 NOTE — Telephone Encounter (Signed)
Pt c/o multiple "spells" this past week- pt feels fine, then vision becomes "surreal", L shoulder discomfort, chest discomfort, dizziness, lasts 30-60 seconds- states that he breaks into a cold sweat after this happens.  No recent physical injury.  States he's had this in the past but is becoming more frequently.  Started lamictal on 12/21.   Is requesting recs.  Dr Sherene SiresWert please advise.  Thank you.

## 2015-01-24 NOTE — Telephone Encounter (Signed)
Called and spoke to pt. Informed pt of the recs per MW. Pt verbalized understanding and denied any further questions or concerns at this time.   

## 2015-01-24 NOTE — Telephone Encounter (Signed)
No

## 2015-01-24 NOTE — Telephone Encounter (Signed)
No way to treat this over the phone > rec go to UC  Or ER

## 2015-03-16 ENCOUNTER — Telehealth: Payer: Self-pay | Admitting: Internal Medicine

## 2015-03-16 NOTE — Telephone Encounter (Signed)
Spoke with pt, c/o recurrent "spells" of surreal vision, dizziness, nausea, "bitter, metallic taste" in mouth, L upper shoulder discomfort, fatigue.  S/s last from 30 to 90 seconds.  After this time passes, pt breaks into a hot sweat.  This has happened 3 times today so far, while laying in bed, eating lunch, and while driving.  Denies fever, chest discomfort.   Pt uses [facility] on [address]    MW please advise.  Thanks!

## 2015-03-16 NOTE — Telephone Encounter (Signed)
Nothing to offer, needs to check with psych

## 2015-03-16 NOTE — Telephone Encounter (Signed)
Called and spoke with pt and he is aware of MW recs.  Nothing further is needed.  

## 2018-08-06 ENCOUNTER — Ambulatory Visit: Payer: Self-pay | Admitting: Internal Medicine

## 2018-08-20 ENCOUNTER — Ambulatory Visit: Payer: Self-pay | Admitting: Internal Medicine

## 2018-09-30 ENCOUNTER — Ambulatory Visit: Payer: Self-pay | Admitting: Interventional Cardiology

## 2019-09-30 ENCOUNTER — Other Ambulatory Visit: Payer: Self-pay | Admitting: Otolaryngology

## 2019-09-30 DIAGNOSIS — R590 Localized enlarged lymph nodes: Secondary | ICD-10-CM

## 2019-10-07 ENCOUNTER — Ambulatory Visit
Admission: RE | Admit: 2019-10-07 | Discharge: 2019-10-07 | Disposition: A | Discharge status: Home / Self Care | Payer: BC Managed Care – PPO | Source: Ambulatory Visit | Attending: Otolaryngology | Admitting: Otolaryngology

## 2019-10-07 DIAGNOSIS — R590 Localized enlarged lymph nodes: Secondary | ICD-10-CM

## 2019-10-07 MED ORDER — IOPAMIDOL (ISOVUE-300) INJECTION 61%
75.0000 mL | Freq: Once | INTRAVENOUS | Status: AC | PRN
  Administered 2019-10-07: 13:00:00 75 mL via INTRAVENOUS

## 2019-11-03 ENCOUNTER — Other Ambulatory Visit: Payer: Self-pay | Admitting: Otolaryngology

## 2019-11-03 DIAGNOSIS — K219 Gastro-esophageal reflux disease without esophagitis: Secondary | ICD-10-CM

## 2019-11-03 DIAGNOSIS — R1312 Dysphagia, oropharyngeal phase: Secondary | ICD-10-CM

## 2019-11-14 ENCOUNTER — Ambulatory Visit
Admission: RE | Admit: 2019-11-14 | Discharge: 2019-11-14 | Disposition: A | Discharge status: Home / Self Care | Payer: BC Managed Care – PPO | Source: Ambulatory Visit | Attending: Otolaryngology | Admitting: Otolaryngology

## 2019-11-14 DIAGNOSIS — K219 Gastro-esophageal reflux disease without esophagitis: Secondary | ICD-10-CM

## 2019-11-14 DIAGNOSIS — R1312 Dysphagia, oropharyngeal phase: Secondary | ICD-10-CM

## 2020-03-15 ENCOUNTER — Ambulatory Visit: Payer: BLUE CROSS/BLUE SHIELD | Attending: Internal Medicine

## 2020-03-15 DIAGNOSIS — Z23 Encounter for immunization: Secondary | ICD-10-CM

## 2020-03-15 NOTE — Progress Notes (Signed)
   Covid-19 Vaccination Clinic  Name:  Raymond [name]    MRN: [id number] DOB: [birth year]  03/15/2020  [name] was observed post Covid-19 immunization for 15 minutes without incident. He was provided with Vaccine Information Sheet and instruction to access the V-Safe system.   [name] was instructed to call 911 with any severe reactions post vaccine: Marland Kitchen Difficulty breathing  . Swelling of face and throat  . A fast heartbeat  . A bad rash all over body  . Dizziness and weakness   Immunizations Administered    Name Date Dose VIS Date Route   Pfizer COVID-19 Vaccine 03/15/2020  4:31 PM 0.3 mL 12/09/2019 Intramuscular   Manufacturer: [employer], [employer]   Lot: [id number]   NDC: [address]-2

## 2020-04-10 ENCOUNTER — Ambulatory Visit: Payer: BLUE CROSS/BLUE SHIELD | Attending: Internal Medicine

## 2020-04-10 DIAGNOSIS — Z23 Encounter for immunization: Secondary | ICD-10-CM

## 2020-04-10 NOTE — Progress Notes (Signed)
   Covid-19 Vaccination Clinic  Name:  [name]    MRN: [id number] DOB: [birth year]  04/10/2020  Raymond [name] was observed post Covid-19 immunization for 15 minutes without incident. He was provided with Vaccine Information Sheet and instruction to access the V-Safe system.   Raymond [name] was instructed to call 911 with any severe reactions post vaccine: Marland Kitchen Difficulty breathing  . Swelling of face and throat  . A fast heartbeat  . A bad rash all over body  . Dizziness and weakness   Immunizations Administered    Name Date Dose VIS Date [name]   Pfizer COVID-19 Vaccine 04/10/2020  8:34 AM 0.3 mL 12/09/2019 Intramuscular   Manufacturer: [employer], [employer]   Lot: [id number]   NDC: [address]-0

## 2020-10-31 ENCOUNTER — Encounter (HOSPITAL_COMMUNITY): Payer: Self-pay | Admitting: Emergency Medicine

## 2020-10-31 ENCOUNTER — Emergency Department (HOSPITAL_COMMUNITY): Payer: BLUE CROSS/BLUE SHIELD

## 2020-10-31 ENCOUNTER — Other Ambulatory Visit: Payer: Self-pay

## 2020-10-31 ENCOUNTER — Emergency Department (HOSPITAL_COMMUNITY)
Admission: EM | Admit: 2020-10-31 | Discharge: 2020-10-31 | Disposition: A | Discharge status: Home / Self Care | Payer: BLUE CROSS/BLUE SHIELD | Attending: Emergency Medicine | Admitting: Emergency Medicine

## 2020-10-31 DIAGNOSIS — R0781 Pleurodynia: Secondary | ICD-10-CM | POA: Diagnosis not present

## 2020-10-31 DIAGNOSIS — I10 Essential (primary) hypertension: Secondary | ICD-10-CM | POA: Diagnosis not present

## 2020-10-31 DIAGNOSIS — M25512 Pain in left shoulder: Secondary | ICD-10-CM | POA: Diagnosis not present

## 2020-10-31 DIAGNOSIS — Z87891 Personal history of nicotine dependence: Secondary | ICD-10-CM | POA: Diagnosis not present

## 2020-10-31 MED ORDER — NAPROXEN 500 MG PO TABS: 500.0000 mg | ORAL_TABLET | Freq: Two times a day (BID) | ORAL | 0 refills | Status: AC

## 2020-10-31 MED ORDER — METHOCARBAMOL 750 MG PO TABS: 750.0000 mg | ORAL_TABLET | Freq: Every evening | ORAL | 0 refills | Status: AC | PRN
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
## 2020-10-31 NOTE — Discharge Instructions (Signed)

## 2021-05-09 ENCOUNTER — Telehealth: Payer: Self-pay | Admitting: Internal Medicine

## 2021-05-09 NOTE — Telephone Encounter (Signed)
Ok with me, thanks.

## 2021-05-09 NOTE — Telephone Encounter (Signed)
Patient's father had an appointment 5.12.22 and asked provider to also see his son, Raymond [name].  Provider approved

## 2021-05-28 ENCOUNTER — Encounter: Payer: Self-pay | Admitting: Internal Medicine

## 2021-05-28 ENCOUNTER — Ambulatory Visit (INDEPENDENT_AMBULATORY_CARE_PROVIDER_SITE_OTHER): Payer: BLUE CROSS/BLUE SHIELD | Admitting: Internal Medicine

## 2021-05-28 ENCOUNTER — Other Ambulatory Visit: Payer: Self-pay

## 2021-05-28 VITALS — BP 102/60 | HR 77 | Temp 98.8°F | Ht 66.0 in | Wt 115.0 lb

## 2021-05-28 DIAGNOSIS — E538 Deficiency of other specified B group vitamins: Secondary | ICD-10-CM | POA: Diagnosis not present

## 2021-05-28 DIAGNOSIS — E7800 Pure hypercholesterolemia, unspecified: Secondary | ICD-10-CM | POA: Diagnosis not present

## 2021-05-28 DIAGNOSIS — Z1159 Encounter for screening for other viral diseases: Secondary | ICD-10-CM

## 2021-05-28 DIAGNOSIS — Z114 Encounter for screening for human immunodeficiency virus [HIV]: Secondary | ICD-10-CM | POA: Diagnosis not present

## 2021-05-28 DIAGNOSIS — Z0000 Encounter for general adult medical examination without abnormal findings: Secondary | ICD-10-CM | POA: Diagnosis not present

## 2021-05-28 DIAGNOSIS — E559 Vitamin D deficiency, unspecified: Secondary | ICD-10-CM | POA: Diagnosis not present

## 2021-05-28 DIAGNOSIS — E785 Hyperlipidemia, unspecified: Secondary | ICD-10-CM | POA: Insufficient documentation

## 2021-05-28 LAB — CBC WITH DIFFERENTIAL/PLATELET
Basophils Absolute: 0.1 10*3/uL (ref 0.0–0.1)
Basophils Relative: 1.3 % (ref 0.0–3.0)
Eosinophils Absolute: 0.1 10*3/uL (ref 0.0–0.7)
Eosinophils Relative: 1.6 % (ref 0.0–5.0)
HCT: 40.3 % (ref 39.0–52.0)
Hemoglobin: 13.6 g/dL (ref 13.0–17.0)
Lymphocytes Relative: 43.6 % (ref 12.0–46.0)
Lymphs Abs: 2.4 10*3/uL (ref 0.7–4.0)
MCHC: 33.7 g/dL (ref 30.0–36.0)
MCV: 93.6 fl (ref 78.0–100.0)
Monocytes Absolute: 0.5 10*3/uL (ref 0.1–1.0)
Monocytes Relative: 9.8 % (ref 3.0–12.0)
Neutro Abs: 2.4 10*3/uL (ref 1.4–7.7)
Neutrophils Relative %: 43.7 % (ref 43.0–77.0)
Platelets: 232 10*3/uL (ref 150.0–400.0)
RBC: 4.31 Mil/uL (ref 4.22–5.81)
RDW: 13 % (ref 11.5–15.5)
WBC: 5.4 10*3/uL (ref 4.0–10.5)

## 2021-05-28 LAB — HEPATIC FUNCTION PANEL
ALT: 11 U/L (ref 0–53)
AST: 17 U/L (ref 0–37)
Albumin: 4.6 g/dL (ref 3.5–5.2)
Alkaline Phosphatase: 39 U/L (ref 39–117)
Bilirubin, Direct: 0.1 mg/dL (ref 0.0–0.3)
Total Bilirubin: 0.4 mg/dL (ref 0.2–1.2)
Total Protein: 6.7 g/dL (ref 6.0–8.3)

## 2021-05-28 LAB — URINALYSIS, ROUTINE W REFLEX MICROSCOPIC
Bilirubin Urine: NEGATIVE
Hgb urine dipstick: NEGATIVE
Ketones, ur: NEGATIVE
Leukocytes,Ua: NEGATIVE
Nitrite: NEGATIVE
RBC / HPF: NONE SEEN (ref 0–?)
Specific Gravity, Urine: 1.025 (ref 1.000–1.030)
Total Protein, Urine: NEGATIVE
Urine Glucose: NEGATIVE
Urobilinogen, UA: 0.2 (ref 0.0–1.0)
pH: 6 (ref 5.0–8.0)

## 2021-05-28 LAB — LIPID PANEL
Cholesterol: 171 mg/dL (ref 0–200)
HDL: 43.3 mg/dL (ref >39.00)
LDL Cholesterol: 117 mg/dL — ABNORMAL HIGH (ref 0–99)
NonHDL: 127.66
Total CHOL/HDL Ratio: 4
Triglycerides: 55 mg/dL (ref 0.0–149.0)
VLDL: 11 mg/dL (ref 0.0–40.0)

## 2021-05-28 LAB — TSH: TSH: 3.07 u[IU]/mL (ref 0.35–4.50)

## 2021-05-28 LAB — BASIC METABOLIC PANEL
BUN: 11 mg/dL (ref 6–23)
CO2: 27 mEq/L (ref 19–32)
Calcium: 9 mg/dL (ref 8.4–10.5)
Chloride: 105 mEq/L (ref 96–112)
Creatinine, Ser: 0.88 mg/dL (ref 0.40–1.50)
GFR: 112.26 mL/min (ref >60.00)
Glucose, Bld: 68 mg/dL — ABNORMAL LOW (ref 70–99)
Potassium: 4.1 mEq/L (ref 3.5–5.1)
Sodium: 140 mEq/L (ref 135–145)

## 2021-05-28 LAB — VITAMIN D 25 HYDROXY (VIT D DEFICIENCY, FRACTURES): VITD: 16.81 ng/mL — ABNORMAL LOW (ref 30.00–100.00)

## 2021-05-28 LAB — VITAMIN B12: Vitamin B-12: 242 pg/mL (ref 211–911)

## 2021-05-28 NOTE — Patient Instructions (Signed)

## 2021-05-28 NOTE — Progress Notes (Signed)
Patient ID: Raymond [name], male   DOB: 1986-03-23, 35 y.o.   MRN: [id number]         Chief Complaint:: wellness exam and New Patient (Initial Visit)         HPI:  Raymond [name] is a 35 y.o. male here for wellness exam; due for hep c screen, o/w up to date with preventive referrals and immunizations  Pt denies chest pain, increased sob or doe, wheezing, orthopnea, PND, increased LE swelling, palpitations, dizziness or syncope.   Pt denies polydipsia, polyuria, or new focal neuro s/s.  Pt denies fever, wt loss, night sweats, loss of appetite, or other constitutional symptoms  Denies worsening depressive symptoms, suicidal ideation, or panic; has ongoing anxiety, not increased recently.    Wt Readings from Last 3 Encounters:  05/28/21 115 lb (52.2 kg)  10/31/20 110 lb (49.9 kg)  11/20/14 108 lb 6.4 oz (49.2 kg)   BP Readings from Last 3 Encounters:  05/28/21 102/60  10/31/20 112/79  11/20/14 (!) 94/56   Immunization History  Administered Date(s) Administered  . Influenza Whole 08/29/2010  . PFIZER(Purple Top)SARS-COV-2 Vaccination 03/15/2020, 04/10/2020  . Pneumococcal Polysaccharide-23 07/29/2010  . Tdap 12/29/2014   Health Maintenance Due  Topic Date Due  . Hepatitis C Screening  Never done      Past Medical History:  Diagnosis Date  . ADD (attention deficit disorder)   . Anxiety   . ATTENTION DEFICIT HYPERACTIVITY DISORDER, ADULT 10/07/2010   Qualifier: Diagnosis of  By: Sherene Sires MD, Charlaine Dalton   . Pneumothorax   . SPONTANEOUS PNEUMOTHORAX 06/05/2010   Followed in Pulmonary clinic/ [facility]/ [facility]  - CT Chest 06/22/2003 5% and no blebs  - Dec 2005 L 5 %  - June 05, 2010 5% > 100% resolved July 15, 2010  - 10/06/2012 recurrence x 10% >  Referred to T Surgery 10/07/2012 >>> -Alpha 1 02/04/13 >115 >     Past Surgical History:  Procedure Laterality Date  . APPENDECTOMY    . EYE SURGERY     numerous eye surgeries  . LAPAROSCOPIC APPENDECTOMY N/A 12/16/2013   Procedure:  APPENDECTOMY LAPAROSCOPIC;  Surgeon: Kandis Cocking, MD;  Location: [facility];  Service: General;  Laterality: N/A;    reports that he quit smoking about 8 years ago. His smoking use included cigarettes. He quit after 1.00 year of use. He has never used smokeless tobacco. He reports current alcohol use. He reports current drug use. Drug: Marijuana. family history includes Asthma in his sister. No Known Allergies No current outpatient medications on file prior to visit.   No current facility-administered medications on file prior to visit.        ROS:  All others reviewed and negative.  Objective        PE:  BP 102/60 (BP Location: Left Arm, Patient Position: Sitting, Cuff Size: Normal)   Pulse 77   Temp 98.8 F (37.1 C) (Oral)   Ht 5\' 6"  (1.676 m)   Wt 115 lb (52.2 kg)   SpO2 98%   BMI 18.56 kg/m                 Constitutional: Pt appears in NAD               HENT: Head: NCAT.                Right Ear: External ear normal.  Left Ear: External ear normal.                Eyes: . Pupils are equal, round, and reactive to light. Conjunctivae and EOM are normal               Nose: without d/c or deformity               Neck: Neck supple. Gross normal ROM               Cardiovascular: Normal rate and regular rhythm.                 Pulmonary/Chest: Effort normal and breath sounds without rales or wheezing.                Abd:  Soft, NT, ND, + BS, no organomegaly               Neurological: Pt is alert. At baseline orientation, motor grossly intact               Skin: Skin is warm. No rashes, no other new lesions, LE edema - none               Psychiatric: Pt behavior is normal without agitation , mild nervous  Micro: none  Cardiac tracings I have personally interpreted today:  none  Pertinent Radiological findings (summarize): none   Lab Results  Component Value Date   WBC 5.4 05/28/2021   HGB 13.6 05/28/2021   HCT 40.3 05/28/2021   PLT 232.0 05/28/2021   GLUCOSE  68 (L) 05/28/2021   CHOL 171 05/28/2021   TRIG 55.0 05/28/2021   HDL 43.30 05/28/2021   LDLCALC 117 (H) 05/28/2021   ALT 11 05/28/2021   AST 17 05/28/2021   NA 140 05/28/2021   K 4.1 05/28/2021   CL 105 05/28/2021   CREATININE 0.88 05/28/2021   BUN 11 05/28/2021   CO2 27 05/28/2021   TSH 3.07 05/28/2021   Assessment/Plan:  Raymond [name] is a 35 y.o. White or Caucasian [1] male with  has a past medical history of ADD (attention deficit disorder), Anxiety, ATTENTION DEFICIT HYPERACTIVITY DISORDER, ADULT (10/07/2010), Pneumothorax, and SPONTANEOUS PNEUMOTHORAX (06/05/2010).  Preventative health care Age and sex appropriate education and counseling updated with regular exercise and diet Referrals for preventative services - for hep c and hiv screen Immunizations addressed - none needed Smoking counseling  - none needed Evidence for depression or other mood disorder - none significant Most recent labs reviewed. I have personally reviewed and have noted: 1) the patient's medical and social history 2) The patient's current medications and supplements 3) The patient's height, weight, and BMI have been recorded in the chart   Vitamin D deficiency Last vitamin D Lab Results  Component Value Date   VD25OH 16.81 (L) 05/28/2021   Low to start oral replacement   HLD (hyperlipidemia) Lab Results  Component Value Date   LDLCALC 117 (H) 05/28/2021   Mild ldl elevation, pt to continue current low chol diet  Followup: Return in about 1 year (around 05/28/2022).  Oliver Barre, MD 05/28/2021 9:59 PM [facility]

## 2021-05-28 NOTE — Assessment & Plan Note (Addendum)
Age and sex appropriate education and counseling updated with regular exercise and diet Referrals for preventative services - for hep c and hiv screen Immunizations addressed - none needed Smoking counseling  - none needed Evidence for depression or other mood disorder - none significant Most recent labs reviewed. I have personally reviewed and have noted: 1) the patient's medical and social history 2) The patient's current medications and supplements 3) The patient's height, weight, and BMI have been recorded in the chart

## 2021-05-28 NOTE — Assessment & Plan Note (Signed)
Lab Results  Component Value Date   LDLCALC 117 (H) 05/28/2021   Mild ldl elevation, pt to continue current low chol diet

## 2021-05-28 NOTE — Assessment & Plan Note (Signed)
Last vitamin D Lab Results  Component Value Date   VD25OH 16.81 (L) 05/28/2021   Low to start oral replacement

## 2021-05-29 LAB — HEPATITIS C ANTIBODY
Hepatitis C Ab: NONREACTIVE
SIGNAL TO CUT-OFF: 0 (ref <1.00)

## 2021-05-29 LAB — HIV ANTIBODY (ROUTINE TESTING W REFLEX): HIV 1&2 Ab, 4th Generation: NONREACTIVE
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
